# Patient Record
Sex: Female | Born: 1970 | Race: White | Hispanic: No | Marital: Married | Smoking: Never smoker
Health system: Southern US, Community
[De-identification: ages and names within clinical notes are randomized; demographics above are authoritative.]

## PROBLEM LIST (undated history)

## (undated) DIAGNOSIS — D759 Disease of blood and blood-forming organs, unspecified: Secondary | ICD-10-CM

## (undated) DIAGNOSIS — Z973 Presence of spectacles and contact lenses: Secondary | ICD-10-CM

## (undated) HISTORY — DX: Disease of blood and blood-forming organs, unspecified: D75.9

---

## 2009-11-17 ENCOUNTER — Ambulatory Visit: Payer: Self-pay | Admitting: Obstetrics & Gynecology

## 2009-11-30 ENCOUNTER — Ambulatory Visit: Payer: Self-pay | Admitting: Obstetrics & Gynecology

## 2009-12-03 HISTORY — PX: BREAST BIOPSY: SHX20

## 2009-12-22 ENCOUNTER — Ambulatory Visit: Payer: Self-pay | Admitting: Surgery

## 2009-12-23 LAB — PATHOLOGY REPORT

## 2013-01-06 ENCOUNTER — Ambulatory Visit: Payer: Self-pay

## 2013-07-07 ENCOUNTER — Ambulatory Visit: Payer: Self-pay

## 2014-01-12 ENCOUNTER — Ambulatory Visit: Payer: Self-pay

## 2014-06-18 ENCOUNTER — Other Ambulatory Visit: Payer: Self-pay | Admitting: Unknown Physician Specialty

## 2014-06-18 DIAGNOSIS — N6320 Unspecified lump in the left breast, unspecified quadrant: Secondary | ICD-10-CM

## 2014-07-14 ENCOUNTER — Ambulatory Visit
Admission: RE | Admit: 2014-07-14 | Discharge: 2014-07-14 | Disposition: A | Discharge status: Home / Self Care | Payer: PRIVATE HEALTH INSURANCE | Source: Ambulatory Visit | Attending: Unknown Physician Specialty | Admitting: Unknown Physician Specialty

## 2014-07-14 DIAGNOSIS — N63 Unspecified lump in breast: Secondary | ICD-10-CM | POA: Insufficient documentation

## 2014-07-14 DIAGNOSIS — N6320 Unspecified lump in the left breast, unspecified quadrant: Secondary | ICD-10-CM

## 2014-12-21 ENCOUNTER — Other Ambulatory Visit: Payer: Self-pay | Admitting: Unknown Physician Specialty

## 2014-12-21 DIAGNOSIS — N63 Unspecified lump in unspecified breast: Secondary | ICD-10-CM

## 2014-12-21 LAB — HM PAP SMEAR: HM Pap smear: NEGATIVE

## 2015-01-18 ENCOUNTER — Ambulatory Visit
Admission: RE | Admit: 2015-01-18 | Discharge: 2015-01-18 | Disposition: A | Discharge status: Home / Self Care | Payer: 59 | Source: Ambulatory Visit | Attending: Unknown Physician Specialty | Admitting: Unknown Physician Specialty

## 2015-01-18 ENCOUNTER — Other Ambulatory Visit: Payer: Self-pay | Admitting: Unknown Physician Specialty

## 2015-01-18 DIAGNOSIS — N63 Unspecified lump in unspecified breast: Secondary | ICD-10-CM

## 2015-06-13 ENCOUNTER — Other Ambulatory Visit: Payer: Self-pay | Admitting: Unknown Physician Specialty

## 2015-06-13 DIAGNOSIS — N63 Unspecified lump in unspecified breast: Secondary | ICD-10-CM

## 2015-07-19 ENCOUNTER — Ambulatory Visit
Admission: RE | Admit: 2015-07-19 | Discharge: 2015-07-19 | Disposition: A | Discharge status: Home / Self Care | Payer: 59 | Source: Ambulatory Visit | Attending: Unknown Physician Specialty | Admitting: Unknown Physician Specialty

## 2015-07-19 DIAGNOSIS — N63 Unspecified lump in unspecified breast: Secondary | ICD-10-CM

## 2016-01-10 ENCOUNTER — Other Ambulatory Visit: Payer: Self-pay | Admitting: Obstetrics & Gynecology

## 2016-01-10 DIAGNOSIS — N63 Unspecified lump in unspecified breast: Secondary | ICD-10-CM

## 2016-01-10 DIAGNOSIS — Z1231 Encounter for screening mammogram for malignant neoplasm of breast: Secondary | ICD-10-CM

## 2016-02-06 ENCOUNTER — Ambulatory Visit
Admission: RE | Admit: 2016-02-06 | Discharge: 2016-02-06 | Disposition: A | Discharge status: Home / Self Care | Payer: 59 | Source: Ambulatory Visit | Attending: Obstetrics & Gynecology | Admitting: Obstetrics & Gynecology

## 2016-02-06 DIAGNOSIS — Z1231 Encounter for screening mammogram for malignant neoplasm of breast: Secondary | ICD-10-CM

## 2016-02-06 DIAGNOSIS — N63 Unspecified lump in unspecified breast: Secondary | ICD-10-CM | POA: Diagnosis present

## 2016-02-06 DIAGNOSIS — N6321 Unspecified lump in the left breast, upper outer quadrant: Secondary | ICD-10-CM | POA: Insufficient documentation

## 2016-02-06 LAB — HM MAMMOGRAPHY

## 2016-02-21 ENCOUNTER — Other Ambulatory Visit: Payer: Self-pay | Admitting: Cardiovascular Disease

## 2016-02-21 DIAGNOSIS — N63 Unspecified lump in unspecified breast: Secondary | ICD-10-CM

## 2016-04-10 DIAGNOSIS — Z0000 Encounter for general adult medical examination without abnormal findings: Secondary | ICD-10-CM | POA: Diagnosis not present

## 2016-08-07 ENCOUNTER — Other Ambulatory Visit: Payer: PRIVATE HEALTH INSURANCE

## 2016-08-07 ENCOUNTER — Ambulatory Visit: Payer: PRIVATE HEALTH INSURANCE

## 2016-09-06 ENCOUNTER — Ambulatory Visit
Admission: RE | Admit: 2016-09-06 | Discharge: 2016-09-06 | Disposition: A | Discharge status: Home / Self Care | Payer: 59 | Source: Ambulatory Visit | Attending: Cardiovascular Disease | Admitting: Cardiovascular Disease

## 2016-09-06 DIAGNOSIS — R922 Inconclusive mammogram: Secondary | ICD-10-CM | POA: Diagnosis not present

## 2016-09-06 DIAGNOSIS — N6489 Other specified disorders of breast: Secondary | ICD-10-CM | POA: Diagnosis not present

## 2016-09-06 DIAGNOSIS — N6320 Unspecified lump in the left breast, unspecified quadrant: Secondary | ICD-10-CM | POA: Insufficient documentation

## 2016-09-06 DIAGNOSIS — N63 Unspecified lump in unspecified breast: Secondary | ICD-10-CM

## 2016-09-06 DIAGNOSIS — D242 Benign neoplasm of left breast: Secondary | ICD-10-CM | POA: Insufficient documentation

## 2016-12-09 DIAGNOSIS — Z23 Encounter for immunization: Secondary | ICD-10-CM | POA: Diagnosis not present

## 2017-01-17 ENCOUNTER — Encounter: Payer: Self-pay | Admitting: Obstetrics & Gynecology

## 2017-01-17 ENCOUNTER — Ambulatory Visit (INDEPENDENT_AMBULATORY_CARE_PROVIDER_SITE_OTHER): Payer: 59 | Admitting: Obstetrics & Gynecology

## 2017-01-17 VITALS — BP 130/90 | HR 82 | Ht 67.0 in | Wt 146.0 lb

## 2017-01-17 DIAGNOSIS — Z0000 Encounter for general adult medical examination without abnormal findings: Secondary | ICD-10-CM

## 2017-01-17 DIAGNOSIS — Z1231 Encounter for screening mammogram for malignant neoplasm of breast: Secondary | ICD-10-CM

## 2017-01-17 DIAGNOSIS — Z1239 Encounter for other screening for malignant neoplasm of breast: Secondary | ICD-10-CM

## 2017-01-17 DIAGNOSIS — Z01419 Encounter for gynecological examination (general) (routine) without abnormal findings: Secondary | ICD-10-CM | POA: Diagnosis not present

## 2017-01-17 NOTE — Patient Instructions (Signed)
PAP every three years Mammogram every year    Call [phone] to schedule at [facility] yearly (with PCP)   

## 2017-01-17 NOTE — Progress Notes (Signed)
HPI:      [name] is a 46 y.o. G0P0000 who LMP was Patient's last menstrual period was 01/07/2017., she presents today for her annual examination. The patient has no complaints today. The patient is sexually active. Her last pap: approximate date 2016 and was normal and last mammogram: approximate date 09/2016 ([facility]) and was normal. The patient does perform self breast exams.  There is notable family history of breast or ovarian cancer in her family.  The patient has regular exercise: yes.  The patient denies current symptoms of depression.    GYN History: Contraception: IUD  PMHx: History reviewed. No pertinent past medical history. Past Surgical History:  Procedure Laterality Date  . BREAST BIOPSY Right 12/03/2009   neg   Family History  Problem Relation Age of Onset  . Breast cancer Mother 41  . Diabetes Father    Social History   Tobacco Use  . Smoking status: Never Smoker  . Smokeless tobacco: Never Used  Substance Use Topics  . Alcohol use: Yes  . Drug use: No   No current outpatient medications on file. Allergies: Patient has no known allergies.  Review of Systems  Constitutional: Negative for chills, fever and malaise/fatigue.  HENT: Negative for congestion, sinus pain and sore throat.   Eyes: Negative for blurred vision and pain.  Respiratory: Negative for cough and wheezing.   Cardiovascular: Negative for chest pain and leg swelling.  Gastrointestinal: Negative for abdominal pain, constipation, diarrhea, heartburn, nausea and vomiting.  Genitourinary: Negative for dysuria, frequency, hematuria and urgency.  Musculoskeletal: Negative for back pain, joint pain, myalgias and neck pain.  Skin: Negative for itching and rash.  Neurological: Negative for dizziness, tremors and weakness.  Endo/Heme/Allergies: Does not bruise/bleed easily.  Psychiatric/Behavioral: Negative for depression. The patient is not nervous/anxious and does not have insomnia.      Objective: BP 130/90   Pulse 82   Ht 5\' 7"  (1.702 m)   Wt 146 lb (66.2 kg)   LMP 01/07/2017   BMI 22.87 kg/m   Filed Weights   01/17/17 0857  Weight: 146 lb (66.2 kg)   Body mass index is 22.87 kg/m. Physical Exam  Constitutional: She is oriented to person, place, and time. She appears well-developed and well-nourished. No distress.  Genitourinary: Rectum normal, vagina normal and uterus normal. Pelvic exam was performed with patient supine. There is no rash or lesion on the right labia. There is no rash or lesion on the left labia. Vagina exhibits no lesion. No bleeding in the vagina. Right adnexum does not display mass and does not display tenderness. Left adnexum does not display mass and does not display tenderness. Cervix does not exhibit motion tenderness, lesion, friability or polyp.   Uterus is mobile and midaxial. Uterus is not enlarged or exhibiting a mass.  Genitourinary Comments: IUD strings 2 cm  HENT:  Head: Normocephalic and atraumatic. Head is without laceration.  Right Ear: Hearing normal.  Left Ear: Hearing normal.  Nose: No epistaxis.  No foreign bodies.  Mouth/Throat: Uvula is midline, oropharynx is clear and moist and mucous membranes are normal.  Eyes: Pupils are equal, round, and reactive to light.  Neck: Normal range of motion. Neck supple. No thyromegaly present.  Cardiovascular: Normal rate and regular rhythm. Exam reveals no gallop and no friction rub.  No murmur heard. Pulmonary/Chest: Effort normal and breath sounds normal. No respiratory distress. She has no wheezes. Right breast exhibits no mass, no skin change and no  tenderness. Left breast exhibits no mass, no skin change and no tenderness.  Abdominal: Soft. Bowel sounds are normal. She exhibits no distension. There is no tenderness. There is no rebound.  Musculoskeletal: Normal range of motion.  Neurological: She is alert and oriented to person, place, and time. No cranial nerve deficit.  Skin:  Skin is warm and dry.  Psychiatric: She has a normal mood and affect. Judgment normal.  Vitals reviewed.   Assessment:  ANNUAL EXAM 1. Annual physical exam   2. Screening for breast cancer      Screening Plan:            1.  Cervical Screening-  Pap smear schedule reviewed with patient  2. Breast screening- Exam annually and mammogram>40 planned   3. Colonoscopy every 10 years, Hemoccult testing - after age 24  4. Labs managed by PCP  5. Counseling for contraception: IUD, due for exchange/removal 2020    F/U  Return in about 1 year (around 01/17/2018) for Annual.  Barnett Applebaum, MD, [facility], [facility] 01/17/2017  9:34 AM

## 2017-02-28 ENCOUNTER — Ambulatory Visit
Admission: RE | Admit: 2017-02-28 | Discharge: 2017-02-28 | Disposition: A | Discharge status: Home / Self Care | Payer: 59 | Source: Ambulatory Visit | Attending: Obstetrics & Gynecology | Admitting: Obstetrics & Gynecology

## 2017-02-28 DIAGNOSIS — Z1231 Encounter for screening mammogram for malignant neoplasm of breast: Secondary | ICD-10-CM | POA: Diagnosis not present

## 2017-02-28 DIAGNOSIS — Z1239 Encounter for other screening for malignant neoplasm of breast: Secondary | ICD-10-CM

## 2017-05-24 DIAGNOSIS — Z0000 Encounter for general adult medical examination without abnormal findings: Secondary | ICD-10-CM | POA: Diagnosis not present

## 2017-12-16 DIAGNOSIS — Z23 Encounter for immunization: Secondary | ICD-10-CM | POA: Diagnosis not present

## 2018-02-06 ENCOUNTER — Ambulatory Visit (INDEPENDENT_AMBULATORY_CARE_PROVIDER_SITE_OTHER): Payer: 59 | Admitting: Obstetrics & Gynecology

## 2018-02-06 ENCOUNTER — Encounter: Payer: Self-pay | Admitting: Obstetrics & Gynecology

## 2018-02-06 ENCOUNTER — Other Ambulatory Visit (HOSPITAL_COMMUNITY)
Admission: RE | Admit: 2018-02-06 | Discharge: 2018-02-06 | Disposition: A | Discharge status: Home / Self Care | Payer: 59 | Source: Ambulatory Visit | Attending: Obstetrics & Gynecology | Admitting: Obstetrics & Gynecology

## 2018-02-06 VITALS — BP 130/90 | Ht 67.0 in | Wt 140.0 lb

## 2018-02-06 DIAGNOSIS — Z124 Encounter for screening for malignant neoplasm of cervix: Secondary | ICD-10-CM | POA: Diagnosis present

## 2018-02-06 DIAGNOSIS — Z1239 Encounter for other screening for malignant neoplasm of breast: Secondary | ICD-10-CM

## 2018-02-06 DIAGNOSIS — Z01419 Encounter for gynecological examination (general) (routine) without abnormal findings: Secondary | ICD-10-CM | POA: Diagnosis not present

## 2018-02-06 DIAGNOSIS — Z0000 Encounter for general adult medical examination without abnormal findings: Secondary | ICD-10-CM

## 2018-02-06 NOTE — Patient Instructions (Signed)
PAP every three years Mammogram every year    Call [phone] to schedule at [facility] every 10 [facility] yearly (with PCP) 

## 2018-02-06 NOTE — Progress Notes (Signed)
HPI:      [name] is a 47 y.o. G0P0000 who LMP was Patient's last menstrual period was 01/16/2018., she presents today for her annual examination. The patient has no complaints today. The patient is sexually active. Her last pap: approximate date 2016 and was normal and last mammogram: approximate date 2018 and was normal. The patient does perform self breast exams.  There is no notable family history of breast or ovarian cancer in her family.  The patient has regular exercise: yes.  The patient denies current symptoms of depression.    GYN History: Contraception: IUD and it is a Paraguard year 9  PMHx: History reviewed. No pertinent past medical history. Past Surgical History:  Procedure Laterality Date  . BREAST BIOPSY Right 12/03/2009   neg   Family History  Problem Relation Age of Onset  . Breast cancer Mother 87  . Hypertension Mother   . Diabetes Father   . Hypertension Father    Social History   Tobacco Use  . Smoking status: Never Smoker  . Smokeless tobacco: Never Used  Substance Use Topics  . Alcohol use: Yes  . Drug use: No    Current Outpatient Medications:  .  PARAGARD INTRAUTERINE COPPER IU, by Intrauterine route., Disp: , Rfl:  Allergies: Patient has no known allergies.  Review of Systems  Constitutional: Negative for chills, fever and malaise/fatigue.  HENT: Negative for congestion, sinus pain and sore throat.   Eyes: Negative for blurred vision and pain.  Respiratory: Negative for cough and wheezing.   Cardiovascular: Negative for chest pain and leg swelling.  Gastrointestinal: Negative for abdominal pain, constipation, diarrhea, heartburn, nausea and vomiting.  Genitourinary: Negative for dysuria, frequency, hematuria and urgency.  Musculoskeletal: Negative for back pain, joint pain, myalgias and neck pain.  Skin: Negative for itching and rash.  Neurological: Negative for dizziness, tremors and weakness.  Endo/Heme/Allergies: Does not  bruise/bleed easily.  Psychiatric/Behavioral: Negative for depression. The patient is not nervous/anxious and does not have insomnia.    Objective: BP 130/90   Ht 5\' 7"  (1.702 m)   Wt 140 lb (63.5 kg)   LMP 01/16/2018   BMI 21.93 kg/m   Filed Weights   02/06/18 0945  Weight: 140 lb (63.5 kg)   Body mass index is 21.93 kg/m. Physical Exam  Constitutional: She is oriented to person, place, and time. She appears well-developed and well-nourished. No distress.  Genitourinary: Rectum normal, vagina normal and uterus normal. Pelvic exam was performed with patient supine. There is no rash or lesion on the right labia. There is no rash or lesion on the left labia. Vagina exhibits no lesion. No bleeding in the vagina. Right adnexum does not display mass and does not display tenderness. Left adnexum does not display mass and does not display tenderness. Cervix does not exhibit motion tenderness, lesion, friability or polyp.   Uterus is mobile and midaxial. Uterus is not enlarged or exhibiting a mass.  Genitourinary Comments: Strings 2 cm  HENT:  Head: Normocephalic and atraumatic. Head is without laceration.  Right Ear: Hearing normal.  Left Ear: Hearing normal.  Nose: No epistaxis.  No foreign bodies.  Mouth/Throat: Uvula is midline, oropharynx is clear and moist and mucous membranes are normal.  Eyes: Pupils are equal, round, and reactive to light.  Neck: Normal range of motion. Neck supple. No thyromegaly present.  Cardiovascular: Normal rate and regular rhythm. Exam reveals no gallop and no friction rub.  No murmur heard. Pulmonary/Chest: Effort  normal and breath sounds normal. No respiratory distress. She has no wheezes. Right breast exhibits no mass, no skin change and no tenderness. Left breast exhibits no mass, no skin change and no tenderness.  Abdominal: Soft. Bowel sounds are normal. She exhibits no distension. There is no tenderness. There is no rebound.  Musculoskeletal: Normal  range of motion.  Neurological: She is alert and oriented to person, place, and time. No cranial nerve deficit.  Skin: Skin is warm and dry.  Psychiatric: She has a normal mood and affect. Judgment normal.  Vitals reviewed.  Assessment:  ANNUAL EXAM 1. Annual physical exam   2. Screening for breast cancer   3. Screening for cervical cancer    Screening Plan:            1.  Cervical Screening-  Pap smear done today  2. Breast screening- Exam annually and mammogram>40 planned   3. Colonoscopy every 10 years, Hemoccult testing - after age 21  4. Labs managed by PCP  5. Counseling for contraception: IUD  Exchange or removal 2020    F/U  Return in about 1 year (around 02/07/2019) for Annual.  Barnett Applebaum, MD, [facility], [facility] 02/06/2018  10:14 AM

## 2018-02-07 LAB — CYTOLOGY - PAP
Diagnosis: NEGATIVE
HPV: NOT DETECTED

## 2018-03-12 ENCOUNTER — Ambulatory Visit
Admission: RE | Admit: 2018-03-12 | Discharge: 2018-03-12 | Disposition: A | Discharge status: Home / Self Care | Payer: 59 | Source: Ambulatory Visit | Attending: Obstetrics & Gynecology | Admitting: Obstetrics & Gynecology

## 2018-03-12 DIAGNOSIS — Z1231 Encounter for screening mammogram for malignant neoplasm of breast: Secondary | ICD-10-CM | POA: Diagnosis not present

## 2018-03-12 DIAGNOSIS — Z1239 Encounter for other screening for malignant neoplasm of breast: Secondary | ICD-10-CM | POA: Insufficient documentation

## 2018-07-31 ENCOUNTER — Encounter: Payer: Self-pay | Admitting: Obstetrics & Gynecology

## 2018-07-31 NOTE — Telephone Encounter (Signed)
Please advise 

## 2018-12-04 DIAGNOSIS — U071 COVID-19: Secondary | ICD-10-CM

## 2018-12-04 HISTORY — DX: COVID-19: U07.1

## 2018-12-12 ENCOUNTER — Other Ambulatory Visit: Payer: Self-pay

## 2018-12-12 DIAGNOSIS — Z20822 Contact with and (suspected) exposure to covid-19: Secondary | ICD-10-CM

## 2018-12-13 LAB — NOVEL CORONAVIRUS, NAA: SARS-CoV-2, NAA: DETECTED — AB

## 2019-03-11 ENCOUNTER — Encounter: Payer: Self-pay | Admitting: Obstetrics & Gynecology

## 2019-03-11 ENCOUNTER — Other Ambulatory Visit: Payer: Self-pay

## 2019-03-11 ENCOUNTER — Ambulatory Visit (INDEPENDENT_AMBULATORY_CARE_PROVIDER_SITE_OTHER): Payer: 59 | Admitting: Obstetrics & Gynecology

## 2019-03-11 VITALS — BP 120/80 | Ht 67.0 in | Wt 140.0 lb

## 2019-03-11 DIAGNOSIS — Z1231 Encounter for screening mammogram for malignant neoplasm of breast: Secondary | ICD-10-CM

## 2019-03-11 DIAGNOSIS — Z30432 Encounter for removal of intrauterine contraceptive device: Secondary | ICD-10-CM | POA: Diagnosis not present

## 2019-03-11 DIAGNOSIS — N951 Menopausal and female climacteric states: Secondary | ICD-10-CM

## 2019-03-11 DIAGNOSIS — Z01419 Encounter for gynecological examination (general) (routine) without abnormal findings: Secondary | ICD-10-CM | POA: Diagnosis not present

## 2019-03-11 NOTE — Progress Notes (Signed)
HPI:      [name] is a 49 y.o. G0P0000 who LMP was Patient's last menstrual period was 01/25/2019., and they have been irregular this past year missing a period every other month or so; also having some hot flashes at times (not too disruptive), as she presents today for her annual examination. The patient has no complaints today and is aware her Kristin Hutchinson is now 10 years in and in need of removal. The patient is sexually active. Her last pap: approximate date 2019 and was normal and last mammogram: approximate date 2020 and was normal. The patient does perform self breast exams.  There is no notable family history of breast or ovarian cancer in her family.  The patient has regular exercise: yes.  The patient denies current symptoms of depression.    GYN History: Contraception: IUD  PMHx: History reviewed. No pertinent past medical history. Past Surgical History:  Procedure Laterality Date  . BREAST BIOPSY Right 12/03/2009   neg   Family History  Problem Relation Age of Onset  . Breast cancer Mother 68  . Hypertension Mother   . Diabetes Father   . Hypertension Father    Social History   Tobacco Use  . Smoking status: Never Smoker  . Smokeless tobacco: Never Used  Substance Use Topics  . Alcohol use: Yes  . Drug use: No    Current Outpatient Medications:  .  PARAGARD INTRAUTERINE COPPER IU, by Intrauterine route., Disp: , Rfl:  Allergies: Patient has no known allergies.  Review of Systems  Constitutional: Negative for chills, fever and malaise/fatigue.  HENT: Negative for congestion, sinus pain and sore throat.   Eyes: Negative for blurred vision and pain.  Respiratory: Negative for cough and wheezing.   Cardiovascular: Negative for chest pain and leg swelling.  Gastrointestinal: Negative for abdominal pain, constipation, diarrhea, heartburn, nausea and vomiting.  Genitourinary: Negative for dysuria, frequency, hematuria and urgency.  Musculoskeletal: Negative  for back pain, joint pain, myalgias and neck pain.  Skin: Negative for itching and rash.  Neurological: Negative for dizziness, tremors and weakness.  Endo/Heme/Allergies: Does not bruise/bleed easily.  Psychiatric/Behavioral: Negative for depression. The patient is not nervous/anxious and does not have insomnia.     Objective: BP 120/80   Ht 5\' 7"  (1.702 m)   Wt 140 lb (63.5 kg)   LMP 01/25/2019   BMI 21.93 kg/m   Filed Weights   03/11/19 0927  Weight: 140 lb (63.5 kg)   Body mass index is 21.93 kg/m. Physical Exam Constitutional:      General: She is not in acute distress.    Appearance: She is well-developed.  Genitourinary:     Pelvic exam was performed with patient supine.     Vagina, uterus and rectum normal.     No lesions in the vagina.     No vaginal bleeding.     No cervical motion tenderness, friability, lesion or polyp.     IUD strings visualized.     Uterus is mobile.     Uterus is not enlarged.     No uterine mass detected.    Uterus is midaxial.     No right or left adnexal mass present.     Right adnexa not tender.     Left adnexa not tender.  HENT:     Head: Normocephalic and atraumatic. No laceration.     Right Ear: Hearing normal.     Left Ear: Hearing normal.  Mouth/Throat:     Pharynx: Uvula midline.  Eyes:     Pupils: Pupils are equal, round, and reactive to light.  Neck:     Thyroid: No thyromegaly.  Cardiovascular:     Rate and Rhythm: Normal rate and regular rhythm.     Heart sounds: No murmur. No friction rub. No gallop.   Pulmonary:     Effort: Pulmonary effort is normal. No respiratory distress.     Breath sounds: Normal breath sounds. No wheezing.  Chest:     Breasts:        Right: No mass, skin change or tenderness.        Left: No mass, skin change or tenderness.     Comments: Fibrous dense breast tissue Abdominal:     General: Bowel sounds are normal. There is no distension.     Palpations: Abdomen is soft.      Tenderness: There is no abdominal tenderness. There is no rebound.  Musculoskeletal:        General: Normal range of motion.     Cervical back: Normal range of motion and neck supple.  Neurological:     Mental Status: She is alert and oriented to person, place, and time.     Cranial Nerves: No cranial nerve deficit.  Skin:    General: Skin is warm and dry.  Psychiatric:        Judgment: Judgment normal.  Vitals reviewed.     Assessment:  ANNUAL EXAM 1. Women's annual routine gynecological examination   2. Encounter for screening mammogram for malignant neoplasm of breast   3. Perimenopausal   4. Encounter for IUD removal      Screening Plan:            1.  Cervical Screening-  Pap smear schedule reviewed with patient  2. Breast screening- Exam annually and mammogram>40 planned   3. Colonoscopy every 10 years, Hemoccult testing - after age 29  4. Labs managed by PCP  5. Counseling for contraception: no method As she is Perimenopausal, will check labs and not continue any contraception at this time - Estradiol - FSH/LH   6.  Encounter for IUD removal See below    F/U  Return in about 1 year (around 03/10/2020) for Annual.  Kristin Applebaum, MD, [facility], [facility] 03/11/2019  10:02 AM   History of Present Illness:  Kristin Hutchinson is a 49 y.o. that had a Paragard IUD placed approximately 10 years ago. Since that time, she states that It has done well but has expired.  The following portions of the patient's history were reviewed and updated as appropriate: allergies, current medications, past family history, past medical history, past social history, past surgical history and problem list.  Patient Active Problem List   Diagnosis Date Noted  . Annual physical exam 01/17/2017   Medications:  Current Outpatient Medications on File Prior to Visit  Medication Sig Dispense Refill  . PARAGARD INTRAUTERINE COPPER IU by Intrauterine route.     No  current facility-administered medications on file prior to visit.   Allergies: has No Known Allergies.  Physical Exam:  BP 120/80   Ht 5\' 7"  (1.702 m)   Wt 140 lb (63.5 kg)   LMP 01/25/2019   BMI 21.93 kg/m  Body mass index is 21.93 kg/m. Constitutional: Well nourished, well developed female in no acute distress.  Abdomen: diffusely non tender to palpation, non distended, and no masses, hernias Neuro: Grossly intact  Psych:  Normal mood and affect.    Pelvic exam:  Two IUD strings present seen coming from the cervical os. EGBUS, vaginal vault and cervix: within normal limits  IUD Removal Strings of IUD identified and grasped.  IUD removed without problem.  Pt tolerated this well.  IUD noted to be intact.  Assessment: IUD Removal  Plan: IUD removed and plan for contraception is no method. Labs (hormonal) to be checked She was amenable to this plan.  Kristin Hutchinson, M.D. 03/11/2019 10:04 AM

## 2019-03-11 NOTE — Patient Instructions (Signed)
PAP every three years Mammogram every year    Call [phone] to schedule at [facility] yearly (with PCP)   

## 2019-03-12 LAB — ESTRADIOL: Estradiol: 9.3 pg/mL

## 2019-03-12 LAB — FSH/LH
FSH: 68.2 m[IU]/mL
LH: 37 m[IU]/mL

## 2019-03-18 ENCOUNTER — Other Ambulatory Visit: Payer: Self-pay

## 2019-03-18 ENCOUNTER — Ambulatory Visit
Admission: RE | Admit: 2019-03-18 | Discharge: 2019-03-18 | Disposition: A | Discharge status: Home / Self Care | Payer: 59 | Source: Ambulatory Visit | Attending: Obstetrics & Gynecology | Admitting: Obstetrics & Gynecology

## 2019-03-18 DIAGNOSIS — Z1231 Encounter for screening mammogram for malignant neoplasm of breast: Secondary | ICD-10-CM | POA: Diagnosis not present

## 2019-04-24 IMAGING — MG DIGITAL SCREENING BILATERAL MAMMOGRAM WITH TOMO AND CAD
8 series · 9 of 24 positions shown · non-contrast
Comparison: Previous exam(s).

CLINICAL DATA: Screening.

EXAM:
DIGITAL SCREENING BILATERAL MAMMOGRAM WITH TOMO AND CAD

[L CC synth-2D]
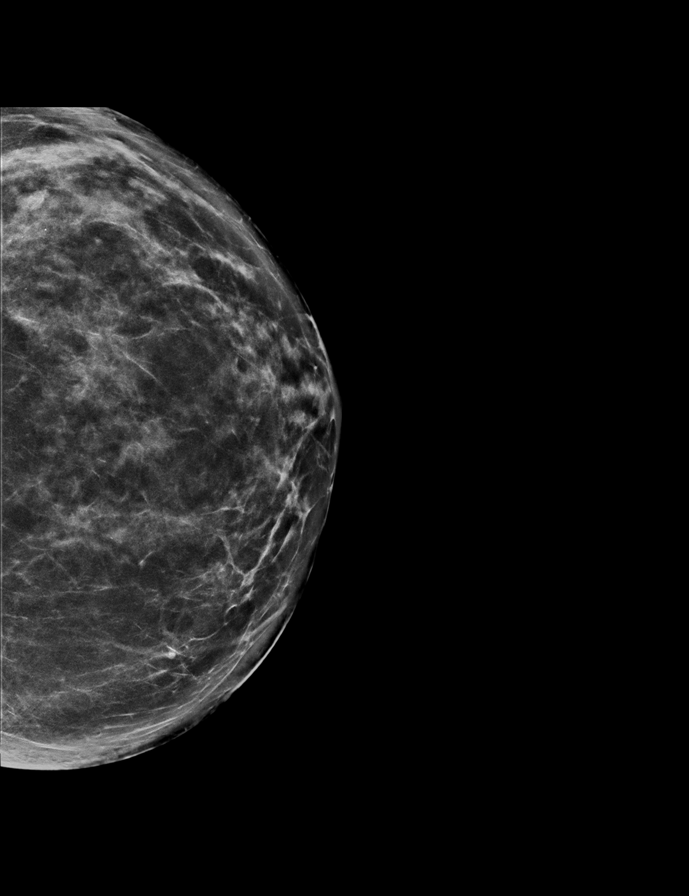

[L MLO synth-2D]
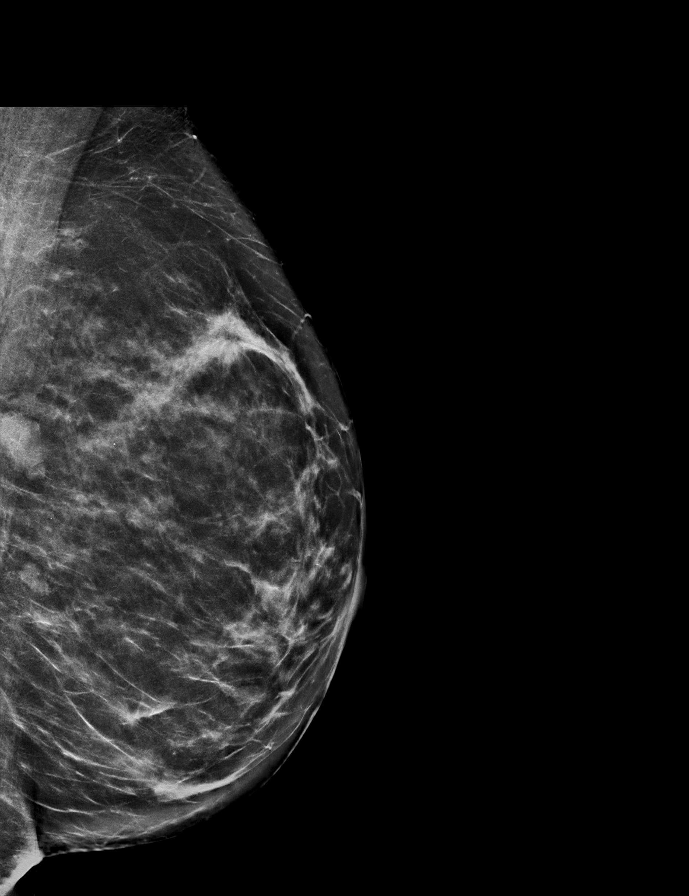

[R CC synth-2D]
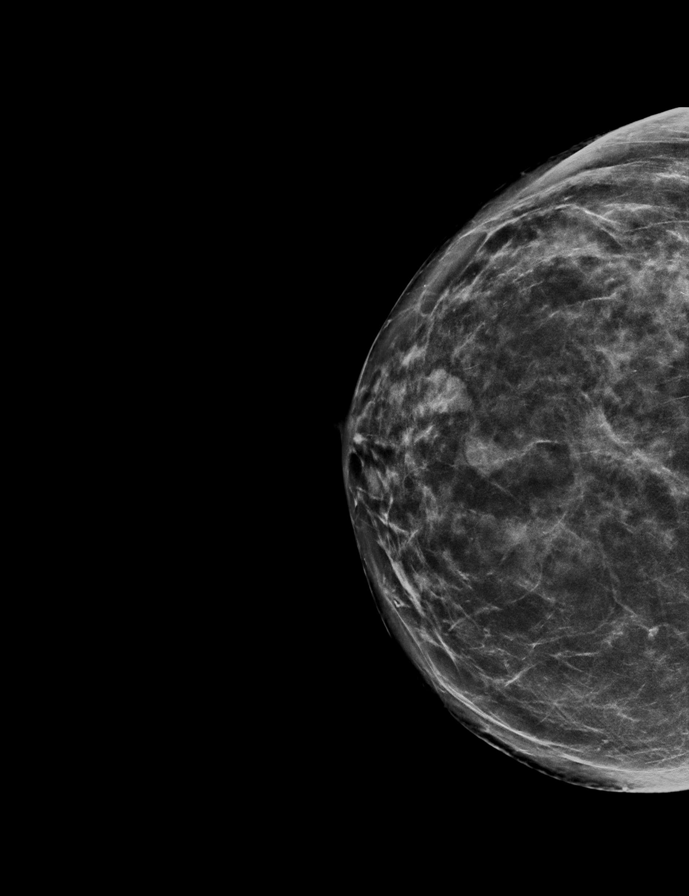

[R MLO synth-2D]
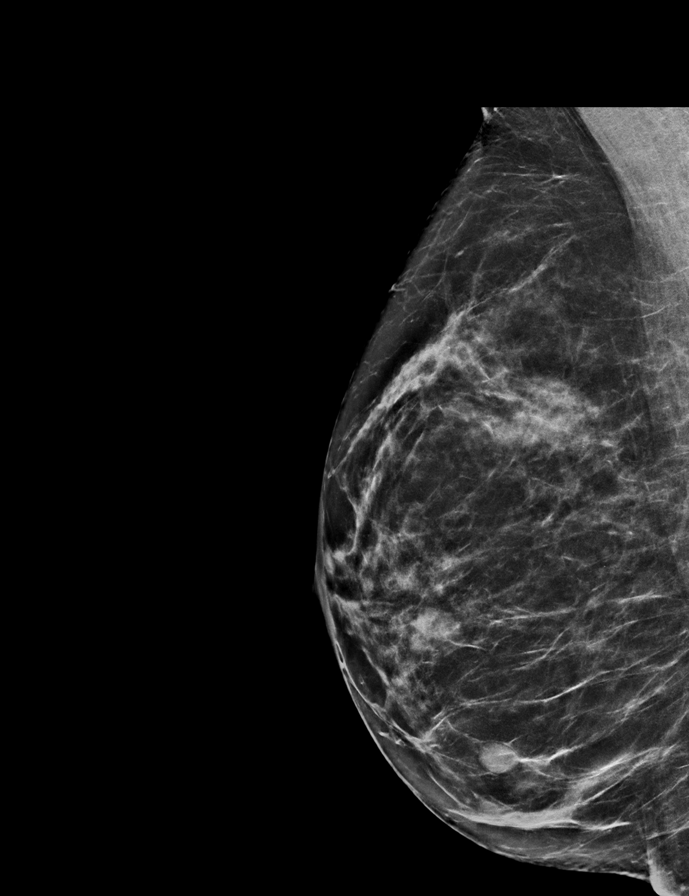

[L MLO tomo · 2 of 70 frames shown]
[frame 23/70]
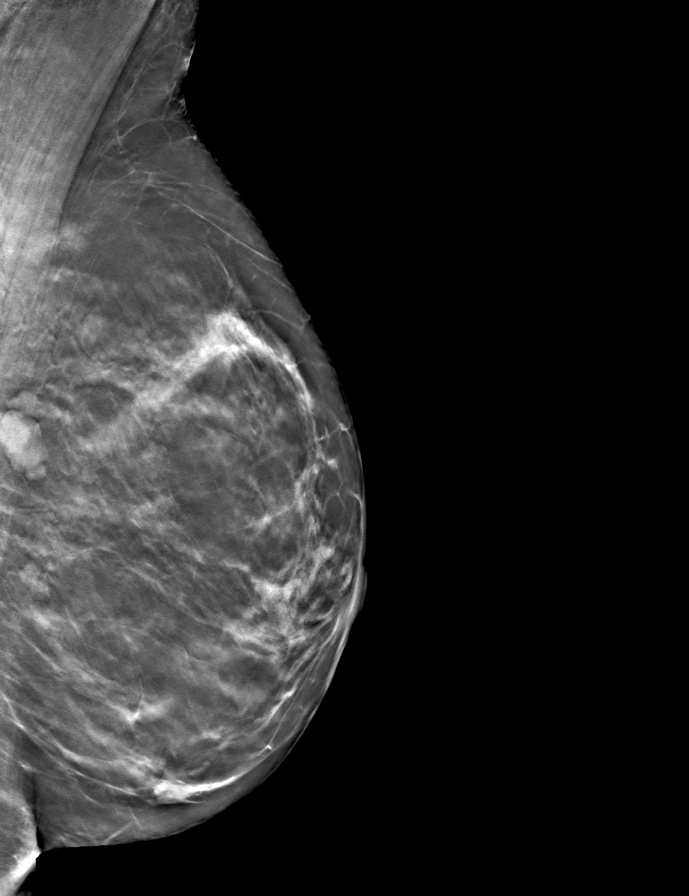
[frame 35/70]
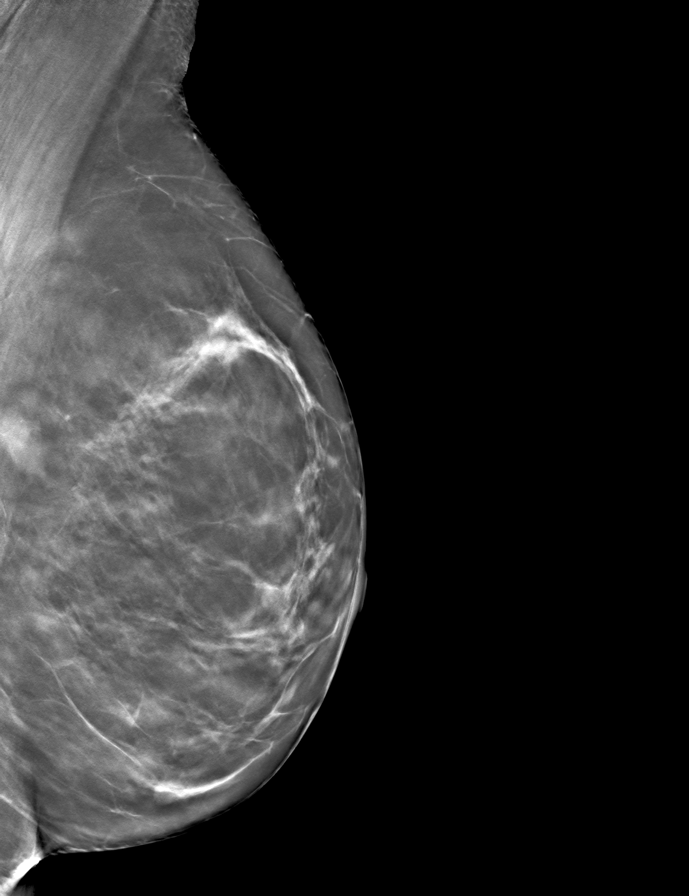

[L CC tomo · tomo slice 35/70.0]
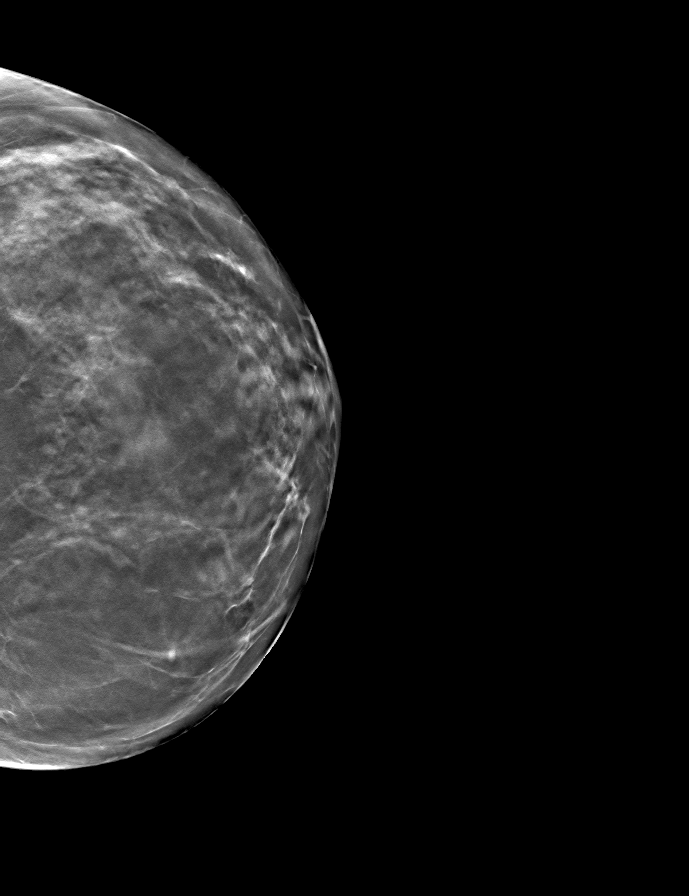

[R MLO tomo · tomo slice 33/65.0]
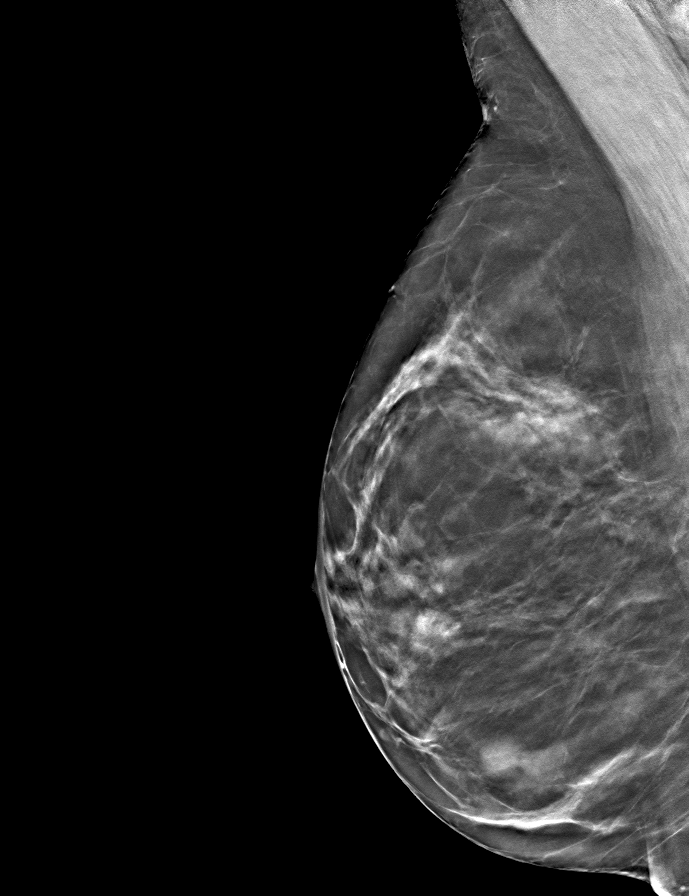

[R CC tomo · tomo slice 35/69.0]
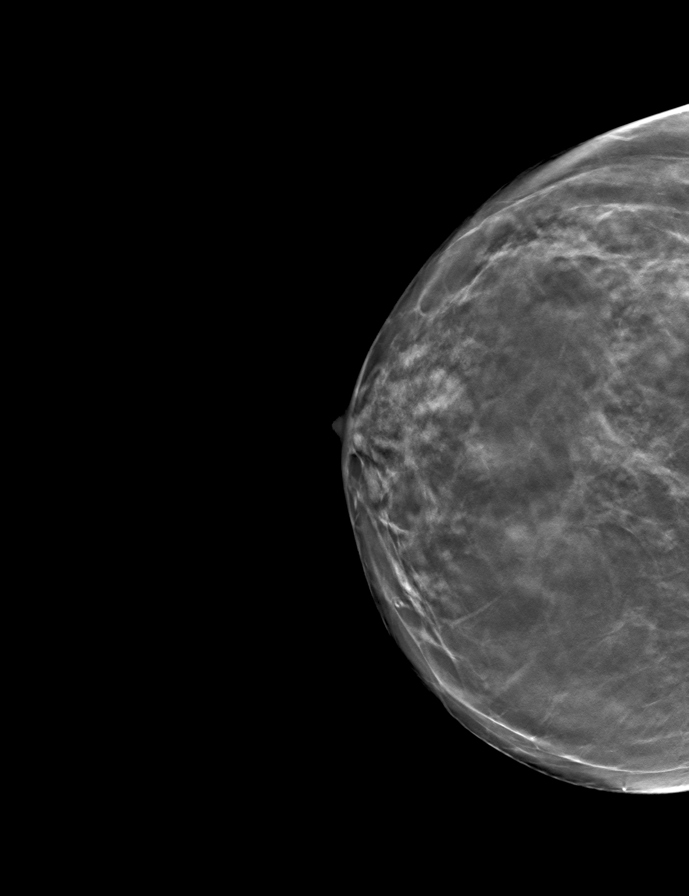

[9 of 24 positions shown; findings below may reference images not displayed]

ACR Breast Density Category b: There are scattered areas of
fibroglandular density.
FINDINGS: There are no findings suspicious for malignancy. Images were
processed with CAD.
IMPRESSION: No mammographic evidence of malignancy. A result letter of this
screening mammogram will be mailed directly to the patient.

RECOMMENDATION:
Screening mammogram in one year. (Code:[id number])

BI-RADS CATEGORY  1: Negative.

## 2019-10-20 NOTE — Progress Notes (Signed)
[facility]  [address] [place], [place] [address] Phone: [phone]  Fax: [phone]   Clinic Day:  10/21/2019  Referring physician: Sofie Hartigan, MD  Chief Complaint: Kristin Hutchinson is a 49 y.o. female with elevated ferritin and a family history of hemachromatosis who is referred in consultation by Dr. Thereasa Distance for assessment and management.   HPI: The patient saw Dr. Ellison Hughs on 09/21/2019.  She noted that her mother was diagnosed with hemachromatosis on 09/03/2019.  Labs revealed a hematocrit of 44.0, hemoglobin 15,0, platelets 214,000, WBC 7,300. CMP was normal. Ferritin was 672 with an iron saturation of 93% and a TIBC of 295.4. Urinalysis revealed trace blood and few bacteria.  Symptomatically, the patient has a runny nose all the time. Her vision has gotten worse lately, which she attributes to her age. Her weight fluctuates +/- 5 lbs. The patient denies fevers, sweats, headaches, sore throat, cough, shortness of breath, chest pain, palpitations, nausea, vomiting, diarrhea, reflux, urinary symptoms, bone or joint symptoms, skin changes, numbness, weakness, balance or coordination problems, and bleeding of any kind.  The patient has not had any other medical problems or surgeries. She does not take any medications regularly or use any herbal products. She eats meat almost daily. She is premenopausal, though she notes that her menses have been irregular since 05/2018. She never had any problems with her periods. She had her IUD removed in 03/2019.  The patient had the 3-series vaccination series for hepatitis B in high school because a cafeteria worker tested positive.   The patient's mother has breast cancer and hemachromatosis. She has two sisters and is unsure if they will be getting tested for hemachromatosis.   History reviewed. No pertinent past medical history.  Past Surgical History:  Procedure Laterality Date  . BREAST  BIOPSY Right 12/03/2009   neg    Family History  Problem Relation Age of Onset  . Breast cancer Mother 77  . Hypertension Mother   . Diabetes Father   . Hypertension Father     Social History:  reports that she has never smoked. She has never used smokeless tobacco. She reports current alcohol use. She reports that she does not use drugs. She drinks beer socially. The patient does not use tobacco. She denies exposure to radiation or toxins. Her mother is Kristin Hutchinson.  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 The patient is alone today.  Allergies: No Known Allergies  Current Medications: No current outpatient medications on file.   No current facility-administered medications for this visit.    Review of Systems  Constitutional: Negative for chills, diaphoresis, fever, malaise/fatigue and weight loss (fluctuates +/- 5 lbs).  HENT: Negative for congestion, ear discharge, ear pain, hearing loss, nosebleeds, sinus pain, sore throat and tinnitus.        Runny nose all the time.  Eyes: Negative for double vision, photophobia and pain.       Vision worsening, she attributes it to her age  Respiratory: Negative.  Negative for cough, hemoptysis, sputum production and shortness of breath.   Cardiovascular: Negative.  Negative for chest pain, palpitations and leg swelling.  Gastrointestinal: Negative.  Negative for abdominal pain, blood in stool, constipation, diarrhea, heartburn, melena, nausea and vomiting.  Genitourinary: Negative for dysuria, frequency, hematuria and urgency.       Irregular menses.  Musculoskeletal: Negative.  Negative for back pain, joint pain, myalgias and neck pain.  Skin: Negative.  Negative for itching and rash.  Neurological: Negative.  Negative for dizziness, tingling, sensory change, weakness and headaches.  Endo/Heme/Allergies: Negative.  Does not bruise/bleed easily.  Psychiatric/Behavioral: Negative.  Negative for  depression and memory loss. The patient is not nervous/anxious and does not have insomnia.   All other systems reviewed and are negative.  Performance status (ECOG): 0  Vitals Blood pressure (!) 155/99, pulse 84, temperature (!) 97.2 F (36.2 C), temperature source Tympanic, resp. rate 18, height 5\' 7"  (1.702 m), weight 142 lb 6.7 oz (64.6 kg), SpO2 100 %.   Physical Exam Vitals and nursing note reviewed.  Constitutional:      General: She is not in acute distress.    Appearance: She is not diaphoretic.     Interventions: Face mask in place.  HENT:     Head: Normocephalic and atraumatic.     Comments: Short styled brown hair.    Mouth/Throat:     Mouth: Mucous membranes are moist.     Pharynx: Oropharynx is clear.  Eyes:     General: No scleral icterus.    Extraocular Movements: Extraocular movements intact.     Conjunctiva/sclera: Conjunctivae normal.     Pupils: Pupils are equal, round, and reactive to light.     Comments: Brown eyes with contacts.  Cardiovascular:     Rate and Rhythm: Normal rate and regular rhythm.     Heart sounds: Normal heart sounds. No murmur heard.   Pulmonary:     Effort: Pulmonary effort is normal. No respiratory distress.     Breath sounds: Normal breath sounds. No wheezing or rales.  Chest:     Chest wall: No tenderness.  Abdominal:     General: Bowel sounds are normal. There is no distension.     Palpations: Abdomen is soft. There is no mass.     Tenderness: There is no abdominal tenderness. There is no guarding or rebound.  Musculoskeletal:        General: No swelling or tenderness. Normal range of motion.     Cervical back: Normal range of motion and neck supple.  Lymphadenopathy:     Head:     Right side of head: No preauricular, posterior auricular or occipital adenopathy.     Left side of head: No preauricular, posterior auricular or occipital adenopathy.     Cervical: No cervical adenopathy.     Upper Body:     Right upper body: No  supraclavicular or axillary adenopathy.     Left upper body: No supraclavicular or axillary adenopathy.     Lower Body: No right inguinal adenopathy. No left inguinal adenopathy.  Skin:    General: Skin is warm and dry.  Neurological:     Mental Status: She is alert and oriented to person, place, and time.  Psychiatric:        Behavior: Behavior normal.        Thought Content: Thought content normal.        Judgment: Judgment normal.    No visits with results within 3 Day(s) from this visit.  Latest known visit with results is:  Office Visit on 03/11/2019  Component Date Value Ref Range Status  . Estradiol 03/11/2019 9.3  pg/mL Final   Comment:                     Adult Female:  Follicular phase   68.3 -   166.0                       Ovulation phase    85.8 -   498.0                       Luteal phase       43.8 -   211.0                       Postmenopausal     <6.0 -    54.7                     Pregnancy                       1st trimester     215.0 - >4300.0 Roche ECLIA methodology   . LH 03/11/2019 37.0  mIU/mL Final   Comment:                     Adult Female:                       Follicular phase      2.4 -  12.6                       Ovulation phase      14.0 -  95.6                       Luteal phase          1.0 -  11.4                       Postmenopausal        7.7 -  58.5   . [facility] 03/11/2019 68.2  mIU/mL Final   Comment:                     Adult Female:                       Follicular phase      3.5 -  12.5                       Ovulation phase       4.7 -  21.5                       Luteal phase          1.7 -   7.7                       Postmenopausal       25.8 - 134.8     Assessment:  Kristin Hutchinson is a 49 y.o. female with elevated ferritin.  Ferritin was 672 with an iron saturation of 93% and a TIBC of 295.4 on 09/21/2019.  Family history is notable for hemochromatosis in her mother.  Her mother also has breast  cancer.  Symptomatically, she feels good.  She denies any symptoms.  Exam is unremarkable.  Plan: 1.   Labs today:  hemochromatosis assay, hepatitis B core antibody total, hepatitis B surface antigen, hepatitis B surface antibody and hepatitis C antibody. 2.  Elevated ferritin  Discuss likely diagnosis of hemochromatosis given elevated iron saturation and elevated ferritin.  Discuss formal testing.  Discuss therapeutic phlebotomy if diagnosis is confirmed to obtain a ferritin 50-100.  Discuss plan for monitoring for Kristin Hutchinson if hemochomatosis confirmed.  Several questions asked and answered. 3.   RTC in 1 week for MD assessment, labs (CBC with diff, ferritin, AFP) and +/- phlebotomy.  I discussed the assessment and treatment plan with the patient.  The patient was provided an opportunity to ask questions and all were answered.  The patient agreed with the plan and demonstrated an understanding of the instructions.  The patient was advised to call back if the symptoms worsen or if the condition fails to improve as anticipated.   Lyliana Dicenso C. Mike Gip, MD, PhD    10/21/2019, 11:10 AM  I, Mirian Mo Tufford, am acting as Education administrator for [employer]. Mike Gip, MD, PhD.  I, Cadel Stairs C. Mike Gip, MD, have reviewed the above documentation for accuracy and completeness, and I agree with the above.

## 2019-10-21 ENCOUNTER — Inpatient Hospital Stay: Payer: 59 | Attending: Hematology and Oncology | Admitting: Hematology and Oncology

## 2019-10-21 ENCOUNTER — Other Ambulatory Visit: Payer: Self-pay

## 2019-10-21 ENCOUNTER — Inpatient Hospital Stay: Payer: 59

## 2019-10-21 ENCOUNTER — Encounter: Payer: Self-pay | Admitting: Hematology and Oncology

## 2019-10-21 VITALS — BP 155/99 | HR 84 | Temp 97.2°F | Resp 18 | Ht 67.0 in | Wt 142.4 lb

## 2019-10-21 DIAGNOSIS — Z8349 Family history of other endocrine, nutritional and metabolic diseases: Secondary | ICD-10-CM

## 2019-10-21 DIAGNOSIS — Z832 Family history of diseases of the blood and blood-forming organs and certain disorders involving the immune mechanism: Secondary | ICD-10-CM | POA: Diagnosis not present

## 2019-10-21 DIAGNOSIS — Z79899 Other long term (current) drug therapy: Secondary | ICD-10-CM | POA: Diagnosis not present

## 2019-10-21 DIAGNOSIS — Z8249 Family history of ischemic heart disease and other diseases of the circulatory system: Secondary | ICD-10-CM | POA: Diagnosis not present

## 2019-10-21 DIAGNOSIS — Z803 Family history of malignant neoplasm of breast: Secondary | ICD-10-CM | POA: Insufficient documentation

## 2019-10-21 DIAGNOSIS — R7989 Other specified abnormal findings of blood chemistry: Secondary | ICD-10-CM

## 2019-10-21 DIAGNOSIS — Z833 Family history of diabetes mellitus: Secondary | ICD-10-CM | POA: Diagnosis not present

## 2019-10-21 DIAGNOSIS — R0989 Other specified symptoms and signs involving the circulatory and respiratory systems: Secondary | ICD-10-CM | POA: Diagnosis not present

## 2019-10-21 LAB — HEPATITIS B CORE ANTIBODY, TOTAL: Hep B Core Total Ab: NONREACTIVE

## 2019-10-21 LAB — HEPATITIS C ANTIBODY: HCV Ab: NONREACTIVE

## 2019-10-21 LAB — HEPATITIS B SURFACE ANTIGEN: Hepatitis B Surface Ag: NONREACTIVE

## 2019-10-21 NOTE — Patient Instructions (Signed)
Hemochromatosis Hemochromatosis is a condition in which the body stores too much iron. This is also called iron storage disease or iron overload disorder. The extra iron builds up in your joints, heart, liver, pancreas, and other organs, where it can cause damage. There are two forms of this condition; they include:  Hereditary hemochromatosis. Defects (mutations) on certain genes can cause symptoms to develop. With this type of the condition, the body absorbs more iron than it needs from the foods you eat.  Secondary hemochromatosis. With this type of the condition, iron builds up in the body due to other reasons, such as from liver disease or blood transfusions. What are the causes? This condition may be caused by:  Abnormal genes passed down from both parents (inherited).  Receiving blood from a donor (blood transfusion).  Problems with the way the body uses iron in the bone marrow (ineffective erythropoiesis).  Having chronic liver disease, such as hepatitis or liver cancer. What increases the risk? You are more likely to develop this condition if you:  Inherit certain abnormal gene mutations from both parents.  Are white (Caucasian).  Have severe or long-term (chronic) anemia. What are the signs or symptoms? Signs and symptoms can start at any age, but they usually start in middle age. They may include:  Fatigue.  Weakness.  Joint pain and stiffness.  Abdominal pain.  Weight loss.  Skin turning a gray or bronze color.  Loss of interest in sex.  Loss of menstrual periods, in women.  Loss of body hair.  Shortness of breath. As hemochromatosis gets worse, it may damage the liver, heart, or pancreas. This may lead to complications such as:  Diabetes.  Liver cancer.  Abnormal heart rhythms.  Heart failure. How is this diagnosed? This condition may be diagnosed based on:  Your symptoms and medical history.  A physical exam.  Blood tests.  Genetic  testing.  Removal and testing of a sample of liver tissue (liver biopsy). How is this treated? This condition is most often treated with:  Therapeutic phlebotomy. In this procedure, some of your blood is removed periodically in order to reduce the amount of iron in your body. Over time, your body will naturally replace the blood cells that you lose during phlebotomy. At the start of treatment, you may have a unit of blood removed once or twice a week. You will have blood tests during this time to determine when your iron levels return to normal. Once your iron levels are normal, you may only need to have a phlebotomy every few months.  Lifestyle changes. This may include not drinking alcohol and limiting certain items in your diet.  Medicines to remove excess iron (chelation therapy).  Medicines to help treat any related conditions.  Genetic counseling. If you are found to have a gene mutation, other family members may need to be tested for hereditary hemochromatosis. Follow these instructions at home: Alcohol use   If you have liver damage, do not drink alcohol.  If you do not have liver damage: ? Do not drink alcohol if:  Your health care provider tells you not to drink.  You are pregnant, may be pregnant, or are planning to become pregnant. ? If you drink alcohol, limit how much you have:  0-1 drink a day for women.  0-2 drinks a day for men. ? Be aware of how much alcohol is in your drink. In the [place] one drink equals one typical bottle of beer (12 oz), one-half glass  of wine (5 oz), or one shot of hard liquor (1 oz). General instructions  Stay active. Exercise for at least 30 minutes on most days of the week.  Take over-the-counter and prescription medicines only as told by your health care provider. This includes vitamins and supplements.  You may need to have frequent blood or urine testing to monitor your condition and to look for complications.  Follow any  instructions as directed by your health care provider regarding dietary restrictions.  Do not: ? Take vitamins or supplements that contain iron. ? Take vitamin C supplements. Vitamin C makes your body absorb more iron from foods. ? Eat raw shellfish or raw fish. Hemochromatosis may increase your chance for infection from these foods.  Keep all follow-up visits as told by your health care provider. This is important. Contact a health care provider if you have:  Fatigue.  Unusual weakness.  Shortness of breath.  Joint pain.  Abdominal pain.  Weight loss. Get help right away if you have:  Chest pain.  Trouble breathing. Summary  Hemochromatosis is a condition in which your body stores too much iron. This is also called iron storage disease or iron overload disorder.  The extra iron builds up in your joints, heart, liver, pancreas, and other organs, where it can cause damage.  Signs and symptoms can start at any age, but they usually start in middle age.  To treat this condition, you will need to have some of your blood removed periodically (therapeutic phlebotomy). Removing some of your blood also removes iron from your body.  You may need to have frequent blood or urine testing to monitor your condition and to look for complications. This information is not intended to replace advice given to you by your health care provider. Make sure you discuss any questions you have with your health care provider. Document Revised: 02/27/2017 Document Reviewed: 02/27/2017 Elsevier Patient Education  2020 [employer].

## 2019-10-22 LAB — HEPATITIS B SURFACE ANTIBODY, QUANTITATIVE: Hep B S AB Quant (Post): <3.1 m[IU]/mL — ABNORMAL LOW (ref >9.9)

## 2019-10-26 LAB — HEMOCHROMATOSIS DNA-PCR(C282Y,H63D)

## 2019-11-02 NOTE — Progress Notes (Signed)
[facility]  [address], [address] [place], [place] [address] Phone: [phone]  Fax: [phone]   Clinic Day:  11/03/2019  Referring physician: Sofie Hartigan, MD  Chief Complaint: Kristin Hutchinson is a 49 y.o. female with elevated ferritin and a family history of hemachromatosis who is seen for review of work-up and discussion regarding direction of therapy.  HPI: The patient was last seen in the hematology clinic on 10/21/2019 for new patient assessment.  Evaluation was prompted by her mother's recent diagnosis of hemochromatosis.  Labs revealed a ferritin was 672 with an iron saturation of 93% on 09/21/2019. She denied any symptoms.  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 H63D and S65C were negative.  Hepatitis B surface antibody was <3.1. Hepatitis B surface antigen, Hepatitis B core antibody, and Hepatitis C antibody were non reactive.   Symptomatically, she has been "good." Her periods are still irregular. The patient agrees to beginning a phlebotomy program today.  The patient notes that Tuesdays are not the best day for her going forward. She prefers any other day.   History reviewed. No pertinent past medical history.  Past Surgical History:  Procedure Laterality Date  . BREAST BIOPSY Right 12/03/2009   neg    Family History  Problem Relation Age of Onset  . Breast cancer Mother 25  . Hypertension Mother   . Diabetes Father   . Hypertension Father     Social History:  reports that she has never smoked. She has never used smokeless tobacco. She reports current alcohol use. She reports that she does not use drugs. She drinks beer socially. The patient does not use tobacco. She denies exposure to radiation or toxins. Her mother is Kristin Hutchinson.  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 The patient is alone today.  Allergies: No Known Allergies  Current Medications: No current outpatient medications on  file.   No current facility-administered medications for this visit.   Review of Systems  Constitutional: Positive for weight loss (1 lb). Negative for chills, diaphoresis, fever and malaise/fatigue.       Feels "good."  HENT: Negative.  Negative for congestion, ear discharge, ear pain, hearing loss, nosebleeds, sinus pain, sore throat and tinnitus.   Eyes: Negative for double vision, photophobia and pain.       Vision worsening, she attributes it to her age  Respiratory: Negative.  Negative for cough, hemoptysis, sputum production and shortness of breath.   Cardiovascular: Negative.  Negative for chest pain, palpitations and leg swelling.  Gastrointestinal: Negative.  Negative for abdominal pain, blood in stool, constipation, diarrhea, heartburn, melena, nausea and vomiting.  Genitourinary: Negative for dysuria, frequency, hematuria and urgency.       Irregular menses.  Musculoskeletal: Negative.  Negative for back pain, joint pain, myalgias and neck pain.  Skin: Negative.  Negative for itching and rash.  Neurological: Negative.  Negative for dizziness, tingling, sensory change, weakness and headaches.  Endo/Heme/Allergies: Negative.  Does not bruise/bleed easily.  Psychiatric/Behavioral: Negative.  Negative for depression and memory loss. The patient is not nervous/anxious and does not have insomnia.   All other systems reviewed and are negative.  Performance status (ECOG): 0  Vitals Blood pressure 138/80, pulse 83, temperature 97.8 F (36.6 C), temperature source Tympanic, resp. rate 18, height 5\' 7"  (1.702 m), weight 141 lb 10.3 oz (64.3 kg), SpO2 100 %.   Physical Exam Vitals and nursing note reviewed.  Constitutional:  General: She is not in acute distress.    Appearance: She is not diaphoretic.     Interventions: Face mask in place.  HENT:     Head: Normocephalic and atraumatic.     Comments: Short styled brown hair. Eyes:     Comments: Brown eyes with contacts.    Cardiovascular:     Rate and Rhythm: Normal rate and regular rhythm.     Heart sounds: Normal heart sounds. No murmur heard.   Pulmonary:     Effort: Pulmonary effort is normal. No respiratory distress.     Breath sounds: Normal breath sounds. No wheezing or rales.  Skin:    General: Skin is warm and dry.  Neurological:     Mental Status: She is alert and oriented to person, place, and time.  Psychiatric:        Behavior: Behavior normal.        Thought Content: Thought content normal.        Judgment: Judgment normal.    Appointment on 11/03/2019  Component Date Value Ref Range Status  . WBC 11/03/2019 5.7  4.0 - 10.5 K/uL Final  . RBC 11/03/2019 4.40  3.87 - 5.11 MIL/uL Final  . Hemoglobin 11/03/2019 15.2* 12.0 - 15.0 g/dL Final  . HCT 11/03/2019 43.8  36 - 46 % Final  . MCV 11/03/2019 99.5  80.0 - 100.0 fL Final  . MCH 11/03/2019 34.5* 26.0 - 34.0 pg Final  . MCHC 11/03/2019 34.7  30.0 - 36.0 g/dL Final  . RDW 11/03/2019 12.5  11.5 - 15.5 % Final  . Platelets 11/03/2019 242  150 - 400 K/uL Final  . nRBC 11/03/2019 0.0  0.0 - 0.2 % Final  . Neutrophils Relative % 11/03/2019 54  % Final  . Neutro Abs 11/03/2019 3.1  1.7 - 7.7 K/uL Final  . Lymphocytes Relative 11/03/2019 33  % Final  . Lymphs Abs 11/03/2019 1.9  0.7 - 4.0 K/uL Final  . Monocytes Relative 11/03/2019 10  % Final  . Monocytes Absolute 11/03/2019 0.6  0 - 1 K/uL Final  . Eosinophils Relative 11/03/2019 2  % Final  . Eosinophils Absolute 11/03/2019 0.1  0 - 0 K/uL Final  . Basophils Relative 11/03/2019 1  % Final  . Basophils Absolute 11/03/2019 0.0  0 - 0 K/uL Final  . Immature Granulocytes 11/03/2019 0  % Final  . Abs Immature Granulocytes 11/03/2019 0.02  0.00 - 0.07 K/uL Final   Performed at [facility], [address], 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 Initial ferritin was 672 with  an iron saturation of 93% and a TIBC of 295.4 on 09/21/2019.  Additional labs on 10/21/2019 revealed hepatitis B surface antibody was <3.1. Hepatitis B surface antigen, hepatitis B core antibody, and hepatitis C antibody were non reactive.   Ferritin has been followed: 672 on 09/21/2019 and 1016 on 11/03/2019.  Family history is notable for hemochromatosis in her mother.  Her mother also has breast cancer.  Symptomatically, she is doing well.  She denies any complaints.  Exam is stable.  Plan: 1.   Labs today: CBC with diff, ferritin, AFP. 2.   Elevated ferritin  Review hereditary hemochromatosis testing.  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  Review plan for [facility] surveillance every 6 months with AFP and RUQ  ultrasound.  Discuss consideration of hepatitis B immunization as serologies documented no immunity. 3.   Phlebotomy today. 4.   Patient needs appointments on any day except Tuesdays. 5.   Schedule RUQ ultrasound. 6.   RTC weekly x 6 for labs (HCT/Hgb, ferritin) and +/- phlebotomy.  Use ferritin from the week before. 7.   RTC in 8 weeks for MD assessment for labs (CBC with diff, ferritin), review imaging, and +/- phlebotomy.  I discussed the assessment and treatment plan with the patient.  The patient was provided an opportunity to ask questions and all were answered.  The patient agreed with the plan and demonstrated an understanding of the instructions.  The patient was advised to call back if the symptoms worsen or if the condition fails to improve as anticipated.   Dixie Jafri C. Mike Gip, MD, PhD    11/03/2019, 10:16 AM  I, Mirian Mo Tufford, am acting as Education administrator for [employer]. Mike Gip, MD, PhD.  I, Takeya Marquis C. Mike Gip, MD, have reviewed the above documentation for accuracy and completeness, and I agree with the above.

## 2019-11-03 ENCOUNTER — Encounter: Payer: Self-pay | Admitting: Hematology and Oncology

## 2019-11-03 ENCOUNTER — Inpatient Hospital Stay: Payer: 59

## 2019-11-03 ENCOUNTER — Inpatient Hospital Stay: Payer: 59 | Admitting: Hematology and Oncology

## 2019-11-03 ENCOUNTER — Other Ambulatory Visit: Payer: Self-pay

## 2019-11-03 VITALS — BP 128/86 | HR 80 | Resp 16

## 2019-11-03 DIAGNOSIS — Z8349 Family history of other endocrine, nutritional and metabolic diseases: Secondary | ICD-10-CM

## 2019-11-03 DIAGNOSIS — R7989 Other specified abnormal findings of blood chemistry: Secondary | ICD-10-CM

## 2019-11-03 LAB — FERRITIN: Ferritin: 1016 ng/mL — ABNORMAL HIGH (ref 11–307)

## 2019-11-03 LAB — CBC WITH DIFFERENTIAL/PLATELET
Abs Immature Granulocytes: 0.02 10*3/uL (ref 0.00–0.07)
Basophils Absolute: 0 10*3/uL (ref 0.0–0.1)
Basophils Relative: 1 %
Eosinophils Absolute: 0.1 10*3/uL (ref 0.0–0.5)
Eosinophils Relative: 2 %
HCT: 43.8 % (ref 36.0–46.0)
Hemoglobin: 15.2 g/dL — ABNORMAL HIGH (ref 12.0–15.0)
Immature Granulocytes: 0 %
Lymphocytes Relative: 33 %
Lymphs Abs: 1.9 10*3/uL (ref 0.7–4.0)
MCH: 34.5 pg — ABNORMAL HIGH (ref 26.0–34.0)
MCHC: 34.7 g/dL (ref 30.0–36.0)
MCV: 99.5 fL (ref 80.0–100.0)
Monocytes Absolute: 0.6 10*3/uL (ref 0.1–1.0)
Monocytes Relative: 10 %
Neutro Abs: 3.1 10*3/uL (ref 1.7–7.7)
Neutrophils Relative %: 54 %
Platelets: 242 10*3/uL (ref 150–400)
RBC: 4.4 MIL/uL (ref 3.87–5.11)
RDW: 12.5 % (ref 11.5–15.5)
WBC: 5.7 10*3/uL (ref 4.0–10.5)
nRBC: 0 % (ref 0.0–0.2)

## 2019-11-03 NOTE — Progress Notes (Signed)
No new changes noted today 

## 2019-11-04 LAB — AFP TUMOR MARKER: AFP, Serum, Tumor Marker: 5.6 ng/mL (ref 0.0–8.3)

## 2019-11-06 ENCOUNTER — Ambulatory Visit
Admission: RE | Admit: 2019-11-06 | Discharge: 2019-11-06 | Disposition: A | Discharge status: Home / Self Care | Payer: 59 | Source: Ambulatory Visit | Attending: Hematology and Oncology | Admitting: Hematology and Oncology

## 2019-11-06 ENCOUNTER — Other Ambulatory Visit: Payer: Self-pay

## 2019-11-06 ENCOUNTER — Ambulatory Visit: Payer: 59

## 2019-11-11 ENCOUNTER — Inpatient Hospital Stay: Payer: 59 | Attending: Hematology and Oncology

## 2019-11-11 ENCOUNTER — Other Ambulatory Visit: Payer: Self-pay

## 2019-11-11 ENCOUNTER — Inpatient Hospital Stay: Payer: 59

## 2019-11-11 VITALS — BP 128/66 | HR 82 | Resp 18

## 2019-11-11 DIAGNOSIS — R7989 Other specified abnormal findings of blood chemistry: Secondary | ICD-10-CM

## 2019-11-11 LAB — HEMOGLOBIN: Hemoglobin: 14.1 g/dL (ref 12.0–15.0)

## 2019-11-11 LAB — HEMATOCRIT: HCT: 40.5 % (ref 36.0–46.0)

## 2019-11-11 LAB — FERRITIN: Ferritin: 940 ng/mL — ABNORMAL HIGH (ref 11–307)

## 2019-11-18 ENCOUNTER — Other Ambulatory Visit: Payer: Self-pay

## 2019-11-18 ENCOUNTER — Inpatient Hospital Stay: Payer: 59

## 2019-11-18 VITALS — BP 125/82 | HR 76 | Temp 96.0°F | Resp 18

## 2019-11-18 DIAGNOSIS — R7989 Other specified abnormal findings of blood chemistry: Secondary | ICD-10-CM

## 2019-11-18 LAB — FERRITIN: Ferritin: 725 ng/mL — ABNORMAL HIGH (ref 11–307)

## 2019-11-18 LAB — HEMATOCRIT: HCT: 38.1 % (ref 36.0–46.0)

## 2019-11-18 LAB — HEMOGLOBIN: Hemoglobin: 13.2 g/dL (ref 12.0–15.0)

## 2019-11-18 MED ORDER — SODIUM CHLORIDE 0.9 % IV SOLN
Freq: Once | INTRAVENOUS | Status: AC
  Filled 2019-11-18: qty 250

## 2019-11-25 ENCOUNTER — Inpatient Hospital Stay: Payer: 59

## 2019-11-25 ENCOUNTER — Other Ambulatory Visit: Payer: Self-pay

## 2019-11-25 VITALS — BP 127/93 | HR 96

## 2019-11-25 DIAGNOSIS — R7989 Other specified abnormal findings of blood chemistry: Secondary | ICD-10-CM

## 2019-11-25 LAB — HEMATOCRIT: HCT: 38.9 % (ref 36.0–46.0)

## 2019-11-25 LAB — FERRITIN: Ferritin: 646 ng/mL — ABNORMAL HIGH (ref 11–307)

## 2019-11-25 LAB — HEMOGLOBIN: Hemoglobin: 13.1 g/dL (ref 12.0–15.0)

## 2019-12-02 ENCOUNTER — Inpatient Hospital Stay: Payer: 59

## 2019-12-07 ENCOUNTER — Inpatient Hospital Stay: Payer: 59 | Attending: Hematology and Oncology

## 2019-12-07 ENCOUNTER — Other Ambulatory Visit: Payer: Self-pay

## 2019-12-07 ENCOUNTER — Inpatient Hospital Stay: Payer: 59

## 2019-12-07 VITALS — BP 147/89 | HR 88 | Temp 96.7°F | Resp 18

## 2019-12-07 DIAGNOSIS — R7989 Other specified abnormal findings of blood chemistry: Secondary | ICD-10-CM

## 2019-12-07 LAB — FERRITIN: Ferritin: 540 ng/mL — ABNORMAL HIGH (ref 11–307)

## 2019-12-07 LAB — HEMATOCRIT: HCT: 40.1 % (ref 36.0–46.0)

## 2019-12-07 LAB — HEMOGLOBIN: Hemoglobin: 13.7 g/dL (ref 12.0–15.0)

## 2019-12-07 MED ORDER — SODIUM CHLORIDE 0.9 % IV SOLN
Freq: Once | INTRAVENOUS | Status: AC
  Filled 2019-12-07: qty 250

## 2019-12-09 ENCOUNTER — Inpatient Hospital Stay: Payer: 59

## 2019-12-16 ENCOUNTER — Inpatient Hospital Stay: Payer: 59

## 2019-12-16 ENCOUNTER — Other Ambulatory Visit: Payer: Self-pay

## 2019-12-16 ENCOUNTER — Other Ambulatory Visit: Payer: Self-pay | Admitting: Hematology and Oncology

## 2019-12-16 VITALS — BP 130/84 | HR 90 | Resp 18

## 2019-12-16 DIAGNOSIS — R7989 Other specified abnormal findings of blood chemistry: Secondary | ICD-10-CM

## 2019-12-16 LAB — FERRITIN: Ferritin: 411 ng/mL — ABNORMAL HIGH (ref 11–307)

## 2019-12-16 LAB — HEMATOCRIT: HCT: 39.1 % (ref 36.0–46.0)

## 2019-12-16 LAB — HEMOGLOBIN: Hemoglobin: 13.3 g/dL (ref 12.0–15.0)

## 2019-12-29 NOTE — Progress Notes (Signed)
[facility]  [address], [address] [place], [place] [address] Phone: [phone]  Fax: [phone]   Clinic Day:  12/30/2019  Referring physician: Sofie Hartigan, MD  Chief Complaint: Kristin Hutchinson is a 49 y.o. 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  HPI: The patient was last seen in the hematology clinic on 11/03/2019. At that time, she was doing well.  She denied any complaints.  Exam was stable. Hematocrit was 43.8, hemoglobin 15.2, platelets 242,000, WBC 5,700. Ferritin was 1,016. AFP was 5.6. She underwent therapeutic phlebotomy.  RUQ ultrasound on 11/06/2019 revealed an unremarkable right upper quadrant ultrasound. Specifically, there was no sonographic evidence of hepatic mass.  Labs followed: 11/11/2019: Hematocrit 40.5. Hemoglobin 14.1. Ferritin 940. 11/18/2019: Hematocrit 38.1. Hemoglobin 13.2. Ferritin 725. 11/25/2019: Hematocrit 38.9. Hemoglobin 13.1. Ferritin 646. 12/07/2019: Hematocrit 40.1. Hemoglobin 13.7. Ferritin 540. 12/16/2019: Hematocrit 39.1. Hemoglobin 13.3. Ferritin 411.  The patient underwent therapeutic phlebotomy weekly x 5 (11/11/2019 - 12/16/2019).  During the interim, she has been "good." Her periods are irregular; her last one was in 10/2019.   History reviewed. No pertinent past medical history.  Past Surgical History:  Procedure Laterality Date  . BREAST BIOPSY Right 12/03/2009   neg    Family History  Problem Relation Age of Onset  . Breast cancer Mother 54  . Hypertension Mother   . Diabetes Father   . Hypertension Father     Social History:  reports that she has never smoked. She has never used smokeless tobacco. She reports current alcohol use. She reports that she does not use drugs. She drinks beer socially. The patient does not use tobacco. She denies exposure to radiation or toxins. Her mother is Renaye Rakers.  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  She has no children. The patient is alone today.  Allergies: No Known Allergies  Current Medications: No current outpatient medications on file.   No current facility-administered medications for this visit.   Review of Systems  Constitutional: Negative for chills, diaphoresis, fever, malaise/fatigue and weight loss (up 2 lbs).       Feels "good."  HENT: Negative.  Negative for congestion, ear discharge, ear pain, hearing loss, nosebleeds, sinus pain, sore throat and tinnitus.   Eyes: Negative for double vision, photophobia and pain.       Vision worsening, she attributes it to her age  Respiratory: Negative.  Negative for cough, hemoptysis, sputum production and shortness of breath.   Cardiovascular: Negative.  Negative for chest pain, palpitations and leg swelling.  Gastrointestinal: Negative.  Negative for abdominal pain, blood in stool, constipation, diarrhea, heartburn, melena, nausea and vomiting.  Genitourinary: Negative for dysuria, frequency, hematuria and urgency.       Irregular menses.  Musculoskeletal: Negative.  Negative for back pain, joint pain, myalgias and neck pain.  Skin: Negative.  Negative for itching and rash.  Neurological: Negative.  Negative for dizziness, tingling, sensory change, weakness and headaches.  Endo/Heme/Allergies: Negative.  Does not bruise/bleed easily.  Psychiatric/Behavioral: Negative.  Negative for depression and memory loss. The patient is not nervous/anxious and does not have insomnia.   All other systems reviewed and are negative.  Performance status (ECOG): 0  Vitals Blood pressure (!) 152/91, pulse 79, temperature 97.9 F (36.6 C), temperature source Tympanic, resp. rate 18, height 5\' 7"  (1.702 m), weight 143 lb 13.6 oz (65.2 kg), SpO2 100 %.   Physical Exam Vitals and nursing note reviewed.  Constitutional:      General: She is not in acute distress.    Appearance: She is not diaphoretic.     Interventions: Face mask in place.   HENT:     Head: Normocephalic and atraumatic.     Comments: Short styled brown hair.    Mouth/Throat:     Mouth: Mucous membranes are moist.     Pharynx: Oropharynx is clear.  Eyes:     General: No scleral icterus.    Extraocular Movements: Extraocular movements intact.     Conjunctiva/sclera: Conjunctivae normal.     Pupils: Pupils are equal, round, and reactive to light.     Comments: Brown eyes with contacts.  Cardiovascular:     Rate and Rhythm: Normal rate and regular rhythm.     Heart sounds: Normal heart sounds. No murmur heard.   Pulmonary:     Effort: Pulmonary effort is normal. No respiratory distress.     Breath sounds: Normal breath sounds. No wheezing or rales.  Abdominal:     General: Bowel sounds are normal. There is no distension.     Palpations: Abdomen is soft. There is no mass.     Tenderness: There is no abdominal tenderness. There is no guarding or rebound.  Musculoskeletal:        General: No swelling or tenderness. Normal range of motion.     Cervical back: Normal range of motion and neck supple.  Lymphadenopathy:     Head:     Right side of head: No preauricular, posterior auricular or occipital adenopathy.     Left side of head: No preauricular, posterior auricular or occipital adenopathy.     Cervical: No cervical adenopathy.     Upper Body:     Right upper body: No supraclavicular or axillary adenopathy.     Left upper body: No supraclavicular or axillary adenopathy.  Skin:    General: Skin is warm and dry.  Neurological:     Mental Status: She is alert and oriented to person, place, and time.  Psychiatric:        Behavior: Behavior normal.        Thought Content: Thought content normal.        Judgment: Judgment normal.    Appointment on 12/30/2019  Component Date Value Ref Range Status  . WBC 12/30/2019 7.4  4.0 - 10.5 K/uL Final  . RBC 12/30/2019 3.83* 3.87 - 5.11 MIL/uL Final  . Hemoglobin 12/30/2019 14.1  12.0 - 15.0 g/dL Final  . HCT  12/30/2019 40.8  36 - 46 % Final  . MCV 12/30/2019 106.5* 80.0 - 100.0 fL Final  . MCH 12/30/2019 36.8* 26.0 - 34.0 pg Final  . MCHC 12/30/2019 34.6  30.0 - 36.0 g/dL Final  . RDW 12/30/2019 13.0  11.5 - 15.5 % Final  . Platelets 12/30/2019 276  150 - 400 K/uL Final  . nRBC 12/30/2019 0.0  0.0 - 0.2 % Final  . Neutrophils Relative % 12/30/2019 63  % Final  . Neutro Abs 12/30/2019 4.7  1.7 - 7.7 K/uL Final  . Lymphocytes Relative 12/30/2019 25  % Final  . Lymphs Abs 12/30/2019 1.9  0.7 - 4.0 K/uL Final  . Monocytes Relative 12/30/2019 10  % Final  . Monocytes Absolute 12/30/2019 0.7  0.1 - 1.0 K/uL Final  . Eosinophils Relative 12/30/2019 1  % Final  . Eosinophils Absolute 12/30/2019 0.1  0.0 - 0.5 K/uL Final  . Basophils Relative 12/30/2019 1  % Final  .  Basophils Absolute 12/30/2019 0.0  0.0 - 0.1 K/uL Final  . Immature Granulocytes 12/30/2019 0  % Final  . Abs Immature Granulocytes 12/30/2019 0.02  0.00 - 0.07 K/uL Final   Performed at [facility], [address], [place], [place] [address]    Assessment:  Kristin Hutchinson is a 49 y.o. 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 Initial ferritin was 672 with an iron saturation of 93% and a TIBC of 295.4 on 09/21/2019.  Additional labs on 10/21/2019 revealed hepatitis B surface antibody was <3.1. Hepatitis B surface antigen, hepatitis B core antibody, and hepatitis C antibody were non reactive.   Ferritin has been followed: 672 on 09/21/2019, 1016 on 11/03/2019, 940 on 11/11/2019, 725 on 11/18/2019, 646 on 11/25/2019, 540 on 12/07/2019, 411 on 12/16/2019, and 434 12/30/2019.  She has been on a phlebotomy program (500cc) since 11/03/2019 (last 12/16/2019).  RUQ ultrasound on 11/06/2019 was unremarkable.  There was no evidence of hepatic mass.  AFP was 5.6 on 11/03/2019.   Family history is notable for hemochromatosis in her mother and sister.  Her mother also has breast cancer.  Symptomatically,  she is doing well.  She voices no concerns.  Exam is stable.  Plan: 1.   Labs today: CBC with diff, ferritin. 2.   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  She undergoes therapeutic phlebotomy to maintain a ferritin 50-100.  She undergoes AFP and RUQ ultrasound for [place] surveillance.   Review interval RUQ ultrasound-unremarkable.  She has hepatitis B and C negative.   Previously we discussed immunizations for hepatitis B.  Ferritin is 434 today  Phlebotomy today. 3.   Patient needs appointments on any day except Tuesdays. 4.   RTC weekly x 4 for labs (HCT/Hgb, ferritin) and +/- phlebotomy.             Use ferritin from the week before. 5.   RTC every other week x 2 (after above) for labs (CBC with diff, ferritin), and +/- phlebotomy. 6.   RTC in 3 months for MD assessment, labs (CBC, ferritin- day before) and +/- phlebotomy.  I discussed the assessment and treatment plan with the patient.  The patient was provided an opportunity to ask questions and all were answered.  The patient agreed with the plan and demonstrated an understanding of the instructions.  The patient was advised to call back if the symptoms worsen or if the condition fails to improve as anticipated.   Takhia Spoon C. Mike Gip, MD, PhD    12/30/2019, 10:44 AM  I, Mirian Mo Tufford, am acting as Education administrator for [employer]. Mike Gip, MD, PhD.  I, Singleton Hickox C. Mike Gip, MD, have reviewed the above documentation for accuracy and completeness, and I agree with the above.

## 2019-12-30 ENCOUNTER — Other Ambulatory Visit: Payer: Self-pay

## 2019-12-30 ENCOUNTER — Encounter: Payer: Self-pay | Admitting: Hematology and Oncology

## 2019-12-30 ENCOUNTER — Ambulatory Visit: Payer: 59

## 2019-12-30 ENCOUNTER — Inpatient Hospital Stay: Payer: 59 | Admitting: Hematology and Oncology

## 2019-12-30 ENCOUNTER — Inpatient Hospital Stay: Payer: 59

## 2019-12-30 LAB — CBC WITH DIFFERENTIAL/PLATELET
Abs Immature Granulocytes: 0.02 10*3/uL (ref 0.00–0.07)
Basophils Absolute: 0 10*3/uL (ref 0.0–0.1)
Basophils Relative: 1 %
Eosinophils Absolute: 0.1 10*3/uL (ref 0.0–0.5)
Eosinophils Relative: 1 %
HCT: 40.8 % (ref 36.0–46.0)
Hemoglobin: 14.1 g/dL (ref 12.0–15.0)
Immature Granulocytes: 0 %
Lymphocytes Relative: 25 %
Lymphs Abs: 1.9 10*3/uL (ref 0.7–4.0)
MCH: 36.8 pg — ABNORMAL HIGH (ref 26.0–34.0)
MCHC: 34.6 g/dL (ref 30.0–36.0)
MCV: 106.5 fL — ABNORMAL HIGH (ref 80.0–100.0)
Monocytes Absolute: 0.7 10*3/uL (ref 0.1–1.0)
Monocytes Relative: 10 %
Neutro Abs: 4.7 10*3/uL (ref 1.7–7.7)
Neutrophils Relative %: 63 %
Platelets: 276 10*3/uL (ref 150–400)
RBC: 3.83 MIL/uL — ABNORMAL LOW (ref 3.87–5.11)
RDW: 13 % (ref 11.5–15.5)
WBC: 7.4 10*3/uL (ref 4.0–10.5)
nRBC: 0 % (ref 0.0–0.2)

## 2019-12-30 LAB — FERRITIN: Ferritin: 434 ng/mL — ABNORMAL HIGH (ref 11–307)

## 2019-12-30 NOTE — Progress Notes (Signed)
No new changes noted today 

## 2020-01-06 ENCOUNTER — Inpatient Hospital Stay: Payer: 59

## 2020-01-06 ENCOUNTER — Other Ambulatory Visit: Payer: Self-pay

## 2020-01-06 ENCOUNTER — Inpatient Hospital Stay: Payer: 59 | Attending: Hematology and Oncology

## 2020-01-06 VITALS — BP 130/80 | HR 86 | Temp 97.2°F | Resp 18

## 2020-01-06 DIAGNOSIS — R7989 Other specified abnormal findings of blood chemistry: Secondary | ICD-10-CM

## 2020-01-06 LAB — HEMATOCRIT: HCT: 36.6 % (ref 36.0–46.0)

## 2020-01-06 LAB — HEMOGLOBIN: Hemoglobin: 12.8 g/dL (ref 12.0–15.0)

## 2020-01-06 LAB — FERRITIN: Ferritin: 351 ng/mL — ABNORMAL HIGH (ref 11–307)

## 2020-01-06 NOTE — Progress Notes (Signed)
Therapeutic phlebotomy of 500 cc performed in clinic today without difficulty. Patient tolerated well. Patient discharged in stable condition.

## 2020-01-12 ENCOUNTER — Other Ambulatory Visit: Payer: Self-pay

## 2020-01-12 DIAGNOSIS — R7989 Other specified abnormal findings of blood chemistry: Secondary | ICD-10-CM

## 2020-01-13 ENCOUNTER — Inpatient Hospital Stay: Payer: 59

## 2020-01-13 ENCOUNTER — Other Ambulatory Visit: Payer: Self-pay

## 2020-01-13 DIAGNOSIS — R7989 Other specified abnormal findings of blood chemistry: Secondary | ICD-10-CM

## 2020-01-13 LAB — FERRITIN: Ferritin: 314 ng/mL — ABNORMAL HIGH (ref 11–307)

## 2020-01-13 LAB — HEMATOCRIT: HCT: 37 % (ref 36.0–46.0)

## 2020-01-13 LAB — HEMOGLOBIN: Hemoglobin: 12.6 g/dL (ref 12.0–15.0)

## 2020-01-13 NOTE — Progress Notes (Signed)
Phlebotomy of 500 cc performed in clinic today; patient tolerated well. Patient discharged in stable condition.

## 2020-01-20 ENCOUNTER — Inpatient Hospital Stay: Payer: 59

## 2020-01-20 ENCOUNTER — Other Ambulatory Visit: Payer: Self-pay

## 2020-01-20 DIAGNOSIS — R7989 Other specified abnormal findings of blood chemistry: Secondary | ICD-10-CM

## 2020-01-20 LAB — HEMATOCRIT: HCT: 37.6 % (ref 36.0–46.0)

## 2020-01-20 LAB — HEMOGLOBIN: Hemoglobin: 13 g/dL (ref 12.0–15.0)

## 2020-01-20 LAB — FERRITIN: Ferritin: 325 ng/mL — ABNORMAL HIGH (ref 11–307)

## 2020-01-20 NOTE — Progress Notes (Signed)
Patient received prescribed treatment today and discharged from clinic in stable condition.

## 2020-01-27 ENCOUNTER — Inpatient Hospital Stay: Payer: 59

## 2020-01-27 ENCOUNTER — Other Ambulatory Visit: Payer: Self-pay

## 2020-01-27 VITALS — BP 138/88 | HR 88 | Temp 97.8°F | Resp 18

## 2020-01-27 DIAGNOSIS — R7989 Other specified abnormal findings of blood chemistry: Secondary | ICD-10-CM

## 2020-01-27 LAB — HEMATOCRIT: HCT: 39.5 % (ref 36.0–46.0)

## 2020-01-27 LAB — FERRITIN: Ferritin: 278 ng/mL (ref 11–307)

## 2020-01-27 LAB — HEMOGLOBIN: Hemoglobin: 13 g/dL (ref 12.0–15.0)

## 2020-01-27 NOTE — Progress Notes (Signed)
Patient received prescribed treatment in clinic. Tolerated well. Patient stable at discharge. 

## 2020-02-08 ENCOUNTER — Other Ambulatory Visit: Payer: Self-pay

## 2020-02-08 DIAGNOSIS — R7989 Other specified abnormal findings of blood chemistry: Secondary | ICD-10-CM

## 2020-02-10 ENCOUNTER — Other Ambulatory Visit: Payer: Self-pay

## 2020-02-10 ENCOUNTER — Other Ambulatory Visit: Payer: Self-pay | Admitting: Hematology and Oncology

## 2020-02-10 ENCOUNTER — Inpatient Hospital Stay: Payer: 59 | Attending: Hematology and Oncology

## 2020-02-10 ENCOUNTER — Inpatient Hospital Stay: Payer: 59

## 2020-02-10 DIAGNOSIS — R7989 Other specified abnormal findings of blood chemistry: Secondary | ICD-10-CM

## 2020-02-10 DIAGNOSIS — D7589 Other specified diseases of blood and blood-forming organs: Secondary | ICD-10-CM

## 2020-02-10 LAB — CBC WITH DIFFERENTIAL/PLATELET
Abs Immature Granulocytes: 0.02 10*3/uL (ref 0.00–0.07)
Basophils Absolute: 0 10*3/uL (ref 0.0–0.1)
Basophils Relative: 1 %
Eosinophils Absolute: 0.1 10*3/uL (ref 0.0–0.5)
Eosinophils Relative: 1 %
HCT: 43 % (ref 36.0–46.0)
Hemoglobin: 14.5 g/dL (ref 12.0–15.0)
Immature Granulocytes: 0 %
Lymphocytes Relative: 26 %
Lymphs Abs: 1.6 10*3/uL (ref 0.7–4.0)
MCH: 35.5 pg — ABNORMAL HIGH (ref 26.0–34.0)
MCHC: 33.7 g/dL (ref 30.0–36.0)
MCV: 105.1 fL — ABNORMAL HIGH (ref 80.0–100.0)
Monocytes Absolute: 0.5 10*3/uL (ref 0.1–1.0)
Monocytes Relative: 8 %
Neutro Abs: 3.9 10*3/uL (ref 1.7–7.7)
Neutrophils Relative %: 64 %
Platelets: 236 10*3/uL (ref 150–400)
RBC: 4.09 MIL/uL (ref 3.87–5.11)
RDW: 12.1 % (ref 11.5–15.5)
WBC: 6.1 10*3/uL (ref 4.0–10.5)
nRBC: 0 % (ref 0.0–0.2)

## 2020-02-10 LAB — FERRITIN: Ferritin: 193 ng/mL (ref 11–307)

## 2020-02-10 NOTE — Progress Notes (Signed)
Pt tolerated phlebotomy 500cc's well, VSS, d/ced to home

## 2020-02-24 ENCOUNTER — Inpatient Hospital Stay: Payer: 59

## 2020-02-24 ENCOUNTER — Other Ambulatory Visit: Payer: Self-pay

## 2020-02-24 VITALS — BP 137/87 | HR 84 | Resp 18

## 2020-02-24 DIAGNOSIS — R7989 Other specified abnormal findings of blood chemistry: Secondary | ICD-10-CM

## 2020-02-24 DIAGNOSIS — D7589 Other specified diseases of blood and blood-forming organs: Secondary | ICD-10-CM

## 2020-02-24 LAB — CBC WITH DIFFERENTIAL/PLATELET
Abs Immature Granulocytes: 0.02 10*3/uL (ref 0.00–0.07)
Basophils Absolute: 0 10*3/uL (ref 0.0–0.1)
Basophils Relative: 1 %
Eosinophils Absolute: 0.1 10*3/uL (ref 0.0–0.5)
Eosinophils Relative: 2 %
HCT: 40.9 % (ref 36.0–46.0)
Hemoglobin: 14.3 g/dL (ref 12.0–15.0)
Immature Granulocytes: 0 %
Lymphocytes Relative: 30 %
Lymphs Abs: 1.8 10*3/uL (ref 0.7–4.0)
MCH: 35.3 pg — ABNORMAL HIGH (ref 26.0–34.0)
MCHC: 35 g/dL (ref 30.0–36.0)
MCV: 101 fL — ABNORMAL HIGH (ref 80.0–100.0)
Monocytes Absolute: 0.6 10*3/uL (ref 0.1–1.0)
Monocytes Relative: 10 %
Neutro Abs: 3.4 10*3/uL (ref 1.7–7.7)
Neutrophils Relative %: 57 %
Platelets: 286 10*3/uL (ref 150–400)
RBC: 4.05 MIL/uL (ref 3.87–5.11)
RDW: 11.6 % (ref 11.5–15.5)
WBC: 5.9 10*3/uL (ref 4.0–10.5)
nRBC: 0 % (ref 0.0–0.2)

## 2020-02-24 LAB — RETICULOCYTES
Immature Retic Fract: 5.6 % (ref 2.3–15.9)
RBC.: 4.03 MIL/uL (ref 3.87–5.11)
Retic Count, Absolute: 72.5 10*3/uL (ref 19.0–186.0)
Retic Ct Pct: 1.8 % (ref 0.4–3.1)

## 2020-02-24 LAB — TSH: TSH: 1.157 u[IU]/mL (ref 0.350–4.500)

## 2020-02-24 LAB — FERRITIN: Ferritin: 164 ng/mL (ref 11–307)

## 2020-02-24 LAB — FOLATE: Folate: 5.1 ng/mL — ABNORMAL LOW (ref >5.9)

## 2020-02-24 LAB — VITAMIN B12: Vitamin B-12: 94 pg/mL — ABNORMAL LOW (ref 180–914)

## 2020-02-24 NOTE — Progress Notes (Signed)
Pt tolerated 500cc phlebotomy well, VSS, d/ced home

## 2020-02-29 ENCOUNTER — Telehealth: Payer: Self-pay

## 2020-02-29 ENCOUNTER — Other Ambulatory Visit: Payer: Self-pay

## 2020-02-29 DIAGNOSIS — R7989 Other specified abnormal findings of blood chemistry: Secondary | ICD-10-CM

## 2020-02-29 NOTE — Telephone Encounter (Signed)
error 

## 2020-02-29 NOTE — Telephone Encounter (Signed)
Spoke with patient regarding b12 being low. Please begin b12 . Will recheck b12 in a one month.

## 2020-03-08 ENCOUNTER — Other Ambulatory Visit: Payer: Self-pay

## 2020-03-09 ENCOUNTER — Inpatient Hospital Stay: Payer: 59

## 2020-03-09 ENCOUNTER — Other Ambulatory Visit: Payer: Self-pay

## 2020-03-09 ENCOUNTER — Inpatient Hospital Stay: Payer: 59 | Attending: Hematology and Oncology

## 2020-03-09 VITALS — BP 134/87 | HR 88 | Temp 96.7°F | Resp 18

## 2020-03-09 DIAGNOSIS — R7989 Other specified abnormal findings of blood chemistry: Secondary | ICD-10-CM

## 2020-03-09 LAB — CBC WITH DIFFERENTIAL/PLATELET
Abs Immature Granulocytes: 0.02 10*3/uL (ref 0.00–0.07)
Basophils Absolute: 0 10*3/uL (ref 0.0–0.1)
Basophils Relative: 0 %
Eosinophils Absolute: 0.1 10*3/uL (ref 0.0–0.5)
Eosinophils Relative: 2 %
HCT: 42.5 % (ref 36.0–46.0)
Hemoglobin: 14.6 g/dL (ref 12.0–15.0)
Immature Granulocytes: 0 %
Lymphocytes Relative: 31 %
Lymphs Abs: 1.9 10*3/uL (ref 0.7–4.0)
MCH: 34.8 pg — ABNORMAL HIGH (ref 26.0–34.0)
MCHC: 34.4 g/dL (ref 30.0–36.0)
MCV: 101.4 fL — ABNORMAL HIGH (ref 80.0–100.0)
Monocytes Absolute: 0.6 10*3/uL (ref 0.1–1.0)
Monocytes Relative: 10 %
Neutro Abs: 3.5 10*3/uL (ref 1.7–7.7)
Neutrophils Relative %: 57 %
Platelets: 224 10*3/uL (ref 150–400)
RBC: 4.19 MIL/uL (ref 3.87–5.11)
RDW: 11.9 % (ref 11.5–15.5)
WBC: 6.2 10*3/uL (ref 4.0–10.5)
nRBC: 0 % (ref 0.0–0.2)

## 2020-03-09 LAB — FERRITIN: Ferritin: 177 ng/mL (ref 11–307)

## 2020-03-09 NOTE — Progress Notes (Signed)
Pt tolerated 500cc phlebotomy without difficulty, vss, pt d/ced home

## 2020-03-22 ENCOUNTER — Inpatient Hospital Stay: Payer: 59

## 2020-03-22 ENCOUNTER — Other Ambulatory Visit: Payer: Self-pay | Admitting: *Deleted

## 2020-03-22 ENCOUNTER — Other Ambulatory Visit: Payer: Self-pay

## 2020-03-22 LAB — CBC WITH DIFFERENTIAL/PLATELET
Abs Immature Granulocytes: 0.01 10*3/uL (ref 0.00–0.07)
Basophils Absolute: 0 10*3/uL (ref 0.0–0.1)
Basophils Relative: 1 %
Eosinophils Absolute: 0.1 10*3/uL (ref 0.0–0.5)
Eosinophils Relative: 2 %
HCT: 39.4 % (ref 36.0–46.0)
Hemoglobin: 13.7 g/dL (ref 12.0–15.0)
Immature Granulocytes: 0 %
Lymphocytes Relative: 30 %
Lymphs Abs: 1.8 10*3/uL (ref 0.7–4.0)
MCH: 34.9 pg — ABNORMAL HIGH (ref 26.0–34.0)
MCHC: 34.8 g/dL (ref 30.0–36.0)
MCV: 100.3 fL — ABNORMAL HIGH (ref 80.0–100.0)
Monocytes Absolute: 0.6 10*3/uL (ref 0.1–1.0)
Monocytes Relative: 10 %
Neutro Abs: 3.4 10*3/uL (ref 1.7–7.7)
Neutrophils Relative %: 57 %
Platelets: 230 10*3/uL (ref 150–400)
RBC: 3.93 MIL/uL (ref 3.87–5.11)
RDW: 12.1 % (ref 11.5–15.5)
WBC: 6 10*3/uL (ref 4.0–10.5)
nRBC: 0 % (ref 0.0–0.2)

## 2020-03-22 LAB — FERRITIN: Ferritin: 114 ng/mL (ref 11–307)

## 2020-03-22 NOTE — Progress Notes (Signed)
[facility]  [address] [place], [place] [address] Phone: [phone]  Fax: [phone]   Clinic Day:  03/23/2020  Referring physician: Sofie Hartigan, MD  Chief Complaint: Kristin Hutchinson is a 50 y.o. 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  HPI: The patient was last seen in the hematology clinic on 12/30/2019. At that time, she was doing well.  She voiced no concerns.  Exam was stable. Hematocrit was 40.8, hemoglobin 14.1, platelets 276,000, WBC 7,400. Ferritin was 434. She underwent phlebotomy.  Labs followed: 01/06/2020:  Hematocrit 36.6. Hemoglobin 12.8. Ferritin 351. 01/13/2020:  Hematocrit 37.0. Hemoglobin 12.6. Ferritin 314. 01/20/2020:  Hematocrit 37.6. Hemoglobin 13.0. Ferritin 325. 01/27/2020:  Hematocrit 39.5. Hemoglobin 13.0. Ferritin 278. 02/10/2020:  Hematocrit 43.0. Hemoglobin 14.5.  MCV 105.1  Ferritin 193. 02/24/2020:  Hematocrit 40.9. Hemoglobin 14.3.  MCV 101.0  Ferritin 164. 03/09/2020:  Hematocrit 42.5. Hemoglobin 14.6.  MCV 101.4  Ferritin 177. 03/22/2020:  Hematocrit 39.4. Hemoglobin 13.7.  MCV 100.3  Ferritin 114.  Additional labs from 02/24/2020: TSH 1.157. Reticulocyte count 1.8%. Vitamin B12 was 94 (low) and folate 5.1 (low).  She was contacted to begin B12 1000 mcg a day and folic acid 1 mg a day.  She underwent phlebotomy weekly x 4 (01/06/2020 - 01/27/2020), then biweekly x 3 (02/10/2020 and 03/09/2020).  During the interim, she has been "good." She has not had any side effects from her phlebotomies. She agrees to a phlebotomy today. She is taking vitamin B12 1000 mcg daily and folic acid 1 mg daily.   Her vision is stable. Her periods are irregular. She had a period in 02/2020, which was her first since 10/2019.   History reviewed. No pertinent past medical history.  Past Surgical History:  Procedure Laterality Date  . BREAST BIOPSY Right 12/03/2009   neg    Family  History  Problem Relation Age of Onset  . Breast cancer Mother 86  . Hypertension Mother   . Diabetes Father   . Hypertension Father     Social History:  reports that she has never smoked. She has never used smokeless tobacco. She reports current alcohol use. She reports that she does not use drugs. She drinks beer socially. The patient does not use tobacco. She denies exposure to radiation or toxins. Her mother is Renaye Rakers.  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 The patient is alone today.  Allergies: No Known Allergies  Current Medications: Current Outpatient Medications  Medication Sig Dispense Refill  . folic acid (FOLVITE) 1 MG tablet Take 1 mg by mouth daily.    . vitamin B-12 (CYANOCOBALAMIN) 1000 MCG tablet Take 1,000 mcg by mouth daily.     No current facility-administered medications for this visit.   Review of Systems  Constitutional: Negative for chills, diaphoresis, fever, malaise/fatigue and weight loss (up 5 lbs).       Feels "good."  HENT: Negative.  Negative for congestion, ear discharge, ear pain, hearing loss, nosebleeds, sinus pain, sore throat and tinnitus.   Eyes: Negative for double vision, photophobia and pain.       Vision is stable.  Respiratory: Negative.  Negative for cough, hemoptysis, sputum production and shortness of breath.   Cardiovascular: Negative.  Negative for chest pain, palpitations and leg swelling.  Gastrointestinal: Negative.  Negative for abdominal pain, blood in stool, constipation, diarrhea, heartburn, melena, nausea and vomiting.  Genitourinary: Negative for dysuria,  frequency, hematuria and urgency.       Irregular menses.  Musculoskeletal: Negative.  Negative for back pain, joint pain, myalgias and neck pain.  Skin: Negative.  Negative for itching and rash.  Neurological: Negative.  Negative for dizziness, tingling, sensory change, weakness and headaches.  Endo/Heme/Allergies: Negative.  Does  not bruise/bleed easily.  Psychiatric/Behavioral: Negative.  Negative for depression and memory loss. The patient is not nervous/anxious and does not have insomnia.   All other systems reviewed and are negative.  Performance status (ECOG): 0  Vitals Blood pressure (!) 141/76, pulse 79, temperature (!) 96 F (35.6 C), temperature source Tympanic, resp. rate 16, weight 148 lb 4.2 oz (67.3 kg), SpO2 100 %.   Physical Exam Vitals and nursing note reviewed.  Constitutional:      General: She is not in acute distress.    Appearance: She is not diaphoretic.     Interventions: Face mask in place.  HENT:     Head: Normocephalic and atraumatic.     Comments: Short styled brown hair.    Mouth/Throat:     Mouth: Mucous membranes are moist.     Pharynx: Oropharynx is clear.  Eyes:     General: No scleral icterus.    Extraocular Movements: Extraocular movements intact.     Conjunctiva/sclera: Conjunctivae normal.     Pupils: Pupils are equal, round, and reactive to light.     Comments: Brown eyes with contacts.  Cardiovascular:     Rate and Rhythm: Normal rate and regular rhythm.     Heart sounds: Normal heart sounds. No murmur heard.   Pulmonary:     Effort: Pulmonary effort is normal. No respiratory distress.     Breath sounds: Normal breath sounds. No wheezing or rales.  Chest:  Breasts:     Right: No axillary adenopathy or supraclavicular adenopathy.     Left: No axillary adenopathy or supraclavicular adenopathy.    Abdominal:     General: Bowel sounds are normal. There is no distension.     Palpations: Abdomen is soft. There is no hepatomegaly, splenomegaly or mass.     Tenderness: There is no abdominal tenderness. There is no guarding or rebound.  Musculoskeletal:        General: No swelling or tenderness. Normal range of motion.     Cervical back: Normal range of motion and neck supple.  Lymphadenopathy:     Head:     Right side of head: No preauricular, posterior  auricular or occipital adenopathy.     Left side of head: No preauricular, posterior auricular or occipital adenopathy.     Cervical: No cervical adenopathy.     Upper Body:     Right upper body: No supraclavicular or axillary adenopathy.     Left upper body: No supraclavicular or axillary adenopathy.  Skin:    General: Skin is warm and dry.  Neurological:     Mental Status: She is alert and oriented to person, place, and time.  Psychiatric:        Behavior: Behavior normal.        Thought Content: Thought content normal.        Judgment: Judgment normal.    Appointment on 03/22/2020  Component Date Value Ref Range Status  . WBC 03/22/2020 6.0  4.0 - 10.5 K/uL Final  . RBC 03/22/2020 3.93  3.87 - 5.11 MIL/uL Final  . Hemoglobin 03/22/2020 13.7  12.0 - 15.0 g/dL Final  . HCT 03/22/2020 39.4  36.0 -  46.0 % Final  . MCV 03/22/2020 100.3* 80.0 - 100.0 fL Final  . MCH 03/22/2020 34.9* 26.0 - 34.0 pg Final  . MCHC 03/22/2020 34.8  30.0 - 36.0 g/dL Final  . RDW 03/22/2020 12.1  11.5 - 15.5 % Final  . Platelets 03/22/2020 230  150 - 400 K/uL Final  . nRBC 03/22/2020 0.0  0.0 - 0.2 % Final  . Neutrophils Relative % 03/22/2020 57  % Final  . Neutro Abs 03/22/2020 3.4  1.7 - 7.7 K/uL Final  . Lymphocytes Relative 03/22/2020 30  % Final  . Lymphs Abs 03/22/2020 1.8  0.7 - 4.0 K/uL Final  . Monocytes Relative 03/22/2020 10  % Final  . Monocytes Absolute 03/22/2020 0.6  0.1 - 1.0 K/uL Final  . Eosinophils Relative 03/22/2020 2  % Final  . Eosinophils Absolute 03/22/2020 0.1  0.0 - 0.5 K/uL Final  . Basophils Relative 03/22/2020 1  % Final  . Basophils Absolute 03/22/2020 0.0  0.0 - 0.1 K/uL Final  . Immature Granulocytes 03/22/2020 0  % Final  . Abs Immature Granulocytes 03/22/2020 0.01  0.00 - 0.07 K/uL Final   Performed at [facility], [address] [place], [place] [address]  . Ferritin 03/22/2020 114  11 - 307 ng/mL Final   Performed at [facility], [address] [place], [place] [address]    Assessment:  DAPHANE FOLK is a 50 y.o. 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 Initial ferritin was 672 with an iron saturation of 93% and a TIBC of 295.4 on 09/21/2019.  Additional labs on 10/21/2019 revealed hepatitis B surface antibody was <3.1. Hepatitis B surface antigen, hepatitis B core antibody, and hepatitis C antibody were non reactive.   Ferritin has been followed: 672 on 09/21/2019, 1016 on 11/03/2019, 940 on 11/11/2019, 725 on 11/18/2019, 646 on 11/25/2019, 540 on 12/07/2019, 411 on 12/16/2019, 434 on 12/30/2019, 351 on 01/06/2020, 314 on 01/13/2020, 325 on 01/20/2020, 278 on 01/27/2020, 193 on 02/10/2020, 164 on 02/24/2020, 177 on 03/09/2020 and 114 on 03/22/2020.    She has been on a phlebotomy program (500cc) since 11/03/2019 (last 03/09/2020).  RUQ ultrasound on 11/06/2019 was unremarkable.  There was no evidence of hepatic mass.  AFP was 5.6 on 11/03/2019.   She developed B12 and folate deficiency.  B12 was 94 and folate 5.1 on 02/24/2020.  She is on oral B12 1000 mcg a day and folate 1 mg a day.  Family history is notable for hemochromatosis in her mother and sister.  Her mother also has breast cancer.  Symptomatically, she feels "good."  Menses are irregular. She had a period in 02/2020, which was her first since 10/2019.  Exam is stable.  Plan: 1.   Review interim labs.  2.   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  She undergoes therapeutic phlebotomy to maintain a ferritin 50-100.  She undergoes AFP and RUQ ultrasound for [place] surveillance.   RUQ ultrasound on 11/06/2019 was negative.     AFP was 5.6 (normal) on 11/03/2019.  She is hepatitis B and C negative.  Ferritin was 114 on 03/22/2020.  Phlebotomy today. 3.   B12 and folate deficiency  She was diagnosed with B12 and folate deficiency on 02/24/2020.  Check antiparietal cell antibodies and intrinsic factor antibodies  to rule out pernicious anemia.  Continue oral 123456 and folic acid.  Check 123456 and folic acid levels around 04/05/2020. 4.   Phlebotomy today. 5.  RTC on 04/05/2020 for labs (CBC, ferritin, B12, folate, anti-parietal cell antibody, intrinsic factor antibody) and +/- phlebotomy on 04/06/2020. 6.   RTC on 04/19/2020 for labs (HCT, Hgb, ferritin) and +/- phlebotomy on 04/20/2020. 7.   RTC on 03/01, 03/29 for labs (HCT, Hgb, ferritin) and call back if phlebotomy needed the next day.   8.   Add AFP on 05/03/2020. 9.   RUQ ultrasound on 05/05/2020. 10.   RTC on 06/30/2019 for MD assessment, labs (CBC with diff, CMP, ferritin- day before) and +/- phlebotomy.  I discussed the assessment and treatment plan with the patient.  The patient was provided an opportunity to ask questions and all were answered.  The patient agreed with the plan and demonstrated an understanding of the instructions.  The patient was advised to call back if the symptoms worsen or if the condition fails to improve as anticipated.   Kristin Jerrell C. Mike Gip, MD, PhD    03/23/2020, 10:39 AM  I, Mirian Mo Tufford, am acting as Education administrator for [employer]. Mike Gip, MD, PhD.  I, Briauna Gilmartin C. Mike Gip, MD, have reviewed the above documentation for accuracy and completeness, and I agree with the above.

## 2020-03-23 ENCOUNTER — Inpatient Hospital Stay (HOSPITAL_BASED_OUTPATIENT_CLINIC_OR_DEPARTMENT_OTHER): Payer: 59 | Admitting: Hematology and Oncology

## 2020-03-23 ENCOUNTER — Other Ambulatory Visit: Payer: Self-pay

## 2020-03-23 ENCOUNTER — Encounter: Payer: Self-pay | Admitting: Hematology and Oncology

## 2020-03-23 ENCOUNTER — Inpatient Hospital Stay: Payer: 59

## 2020-03-23 VITALS — BP 133/92 | HR 88

## 2020-03-23 DIAGNOSIS — E538 Deficiency of other specified B group vitamins: Secondary | ICD-10-CM | POA: Diagnosis not present

## 2020-03-23 DIAGNOSIS — D7589 Other specified diseases of blood and blood-forming organs: Secondary | ICD-10-CM

## 2020-03-23 DIAGNOSIS — R7989 Other specified abnormal findings of blood chemistry: Secondary | ICD-10-CM

## 2020-03-23 NOTE — Progress Notes (Signed)
Pt received prescribed treatment in clinic, pt stable at d/c. 

## 2020-03-31 ENCOUNTER — Other Ambulatory Visit: Payer: Self-pay

## 2020-03-31 ENCOUNTER — Other Ambulatory Visit: Payer: 59

## 2020-03-31 DIAGNOSIS — E538 Deficiency of other specified B group vitamins: Secondary | ICD-10-CM

## 2020-04-05 ENCOUNTER — Inpatient Hospital Stay: Payer: 59 | Attending: Hematology and Oncology

## 2020-04-05 ENCOUNTER — Other Ambulatory Visit: Payer: Self-pay

## 2020-04-05 DIAGNOSIS — D7589 Other specified diseases of blood and blood-forming organs: Secondary | ICD-10-CM | POA: Insufficient documentation

## 2020-04-05 DIAGNOSIS — E538 Deficiency of other specified B group vitamins: Secondary | ICD-10-CM

## 2020-04-05 DIAGNOSIS — R7989 Other specified abnormal findings of blood chemistry: Secondary | ICD-10-CM

## 2020-04-05 LAB — CBC
HCT: 42.4 % (ref 36.0–46.0)
Hemoglobin: 14.6 g/dL (ref 12.0–15.0)
MCH: 34.6 pg — ABNORMAL HIGH (ref 26.0–34.0)
MCHC: 34.4 g/dL (ref 30.0–36.0)
MCV: 100.5 fL — ABNORMAL HIGH (ref 80.0–100.0)
Platelets: 232 10*3/uL (ref 150–400)
RBC: 4.22 MIL/uL (ref 3.87–5.11)
RDW: 12.5 % (ref 11.5–15.5)
WBC: 6.4 10*3/uL (ref 4.0–10.5)
nRBC: 0 % (ref 0.0–0.2)

## 2020-04-05 LAB — FERRITIN: Ferritin: 109 ng/mL (ref 11–307)

## 2020-04-05 LAB — FOLATE: Folate: 13.5 ng/mL (ref >5.9)

## 2020-04-05 LAB — VITAMIN B12: Vitamin B-12: 234 pg/mL (ref 180–914)

## 2020-04-06 ENCOUNTER — Other Ambulatory Visit: Payer: Self-pay | Admitting: Hematology and Oncology

## 2020-04-06 ENCOUNTER — Inpatient Hospital Stay: Payer: 59

## 2020-04-06 ENCOUNTER — Telehealth: Payer: Self-pay

## 2020-04-06 VITALS — BP 137/93 | HR 77 | Temp 96.0°F | Resp 16

## 2020-04-06 DIAGNOSIS — R7989 Other specified abnormal findings of blood chemistry: Secondary | ICD-10-CM

## 2020-04-06 LAB — INTRINSIC FACTOR ANTIBODIES: Intrinsic Factor: 1 AU/mL (ref 0.0–1.1)

## 2020-04-06 LAB — ANTI-PARIETAL ANTIBODY: Parietal Cell Antibody-IgG: 18.4 Units (ref 0.0–20.0)

## 2020-04-06 MED ORDER — CYANOCOBALAMIN 1000 MCG/ML IJ SOLN
1000.0000 ug | Freq: Once | INTRAMUSCULAR | Status: AC
  Administered 2020-04-06: 1000 ug via INTRAMUSCULAR

## 2020-04-06 NOTE — Progress Notes (Signed)
Patient received prescribed treatment in clinic. Phlebotomy 500 cc and B12 injection. Tolerated well. Patient stable at discharge.

## 2020-04-12 ENCOUNTER — Other Ambulatory Visit: Payer: Self-pay

## 2020-04-12 ENCOUNTER — Ambulatory Visit (INDEPENDENT_AMBULATORY_CARE_PROVIDER_SITE_OTHER): Payer: 59 | Admitting: Obstetrics & Gynecology

## 2020-04-12 ENCOUNTER — Encounter: Payer: Self-pay | Admitting: Obstetrics & Gynecology

## 2020-04-12 VITALS — BP 100/60 | Ht 67.0 in | Wt 150.0 lb

## 2020-04-12 DIAGNOSIS — Z01419 Encounter for gynecological examination (general) (routine) without abnormal findings: Secondary | ICD-10-CM

## 2020-04-12 DIAGNOSIS — Z1211 Encounter for screening for malignant neoplasm of colon: Secondary | ICD-10-CM

## 2020-04-12 DIAGNOSIS — Z1231 Encounter for screening mammogram for malignant neoplasm of breast: Secondary | ICD-10-CM

## 2020-04-12 NOTE — Patient Instructions (Signed)
PAP every three years Mammogram every year    Call [phone] to schedule at [facility] Colonoscopy every 10 years  Thank you for choosing [facility]. As part of our ongoing efforts to improve patient experience, we would appreciate your feedback. Please fill out the short survey that you will receive by mail or MyChart. Your opinion is important to [place]! - Dr. Kenton Kingfisher  Recommendations to boost your immunity to prevent illness such as viral flu and colds, including covid19, are as follows:       - - -  Vitamin K2 and Vitamin D3  - - - Take Vitamin K2 at 200-300 mcg daily (usually 2-3 pills daily of the over the counter formulation). Take Vitamin D3 at 3000-4000 U daily (usually 3-4 pills daily of the over the counter formulation). Studies show that these two at high normal levels in your system are very effective in keeping your immunity so strong and protective that you will be unlikely to contract viral illness such as those listed above.  Dr Kenton Kingfisher

## 2020-04-12 NOTE — Progress Notes (Signed)
HPI:      [name] is a 50 y.o. G0P0000 who LMP was in the past, she presents today for her annual examination.  The patient has no complaints today. The patient is sexually active. Herlast pap: approximate date 2019 and was normal and last mammogram: approximate date 2021 and was normal.  The patient does perform self breast exams.  There is no notable family history of breast or ovarian cancer in her family. The patient is not taking hormone replacement therapy. Rare hot flash.  Cyclic bleeding is reported: periods every 3-4 mos this past year.   The patient has regular exercise: yes. The patient denies current symptoms of depression.    GYN Hx: Last Colonoscopy:never ago.  PMHx: Past Medical History:  Diagnosis Date  . Blood disorder    Past Surgical History:  Procedure Laterality Date  . BREAST BIOPSY Right 12/03/2009   neg   Family History  Problem Relation Age of Onset  . Breast cancer Mother 14  . Hypertension Mother   . Diabetes Father   . Hypertension Father    Social History   Tobacco Use  . Smoking status: Never Smoker  . Smokeless tobacco: Never Used  Vaping Use  . Vaping Use: Never used  Substance Use Topics  . Alcohol use: Yes  . Drug use: No    Current Outpatient Medications:  .  folic acid (FOLVITE) 1 MG tablet, Take 1 mg by mouth daily., Disp: , Rfl:  .  vitamin B-12 (CYANOCOBALAMIN) 1000 MCG tablet, Take 1,000 mcg by mouth daily., Disp: , Rfl:  Allergies: Patient has no known allergies.  Review of Systems  Constitutional: Negative for chills, fever and malaise/fatigue.  HENT: Negative for congestion, sinus pain and sore throat.   Eyes: Negative for blurred vision and pain.  Respiratory: Negative for cough and wheezing.   Cardiovascular: Negative for chest pain and leg swelling.  Gastrointestinal: Negative for abdominal pain, constipation, diarrhea, heartburn, nausea and vomiting.  Genitourinary: Negative for dysuria, frequency, hematuria and  urgency.  Musculoskeletal: Negative for back pain, joint pain, myalgias and neck pain.  Skin: Negative for itching and rash.  Neurological: Negative for dizziness, tremors and weakness.  Endo/Heme/Allergies: Does not bruise/bleed easily.  Psychiatric/Behavioral: Negative for depression. The patient is not nervous/anxious and does not have insomnia.     Objective: BP 100/60   Ht 5\' 7"  (1.702 m)   Wt 150 lb (68 kg)   BMI 23.49 kg/m   Filed Weights   04/12/20 0956  Weight: 150 lb (68 kg)   Body mass index is 23.49 kg/m. Physical Exam Constitutional:      General: She is not in acute distress.    Appearance: She is well-developed and well-nourished.  Genitourinary:     Vagina, uterus and rectum normal.     There is no rash or lesion on the right labia.     There is no rash or lesion on the left labia.    No lesions in the vagina.     No vaginal bleeding.      Right Adnexa: not tender and no mass present.    Left Adnexa: not tender and no mass present.    No cervical motion tenderness, friability, lesion or polyp.     Uterus is mobile.     Uterus is not enlarged.     No uterine mass detected.    Uterus is midaxial.     Pelvic exam was performed with patient supine.  Breasts:     Right: No mass, skin change or tenderness.     Left: No mass, skin change or tenderness.    HENT:     Head: Normocephalic and atraumatic. No laceration.     Right Ear: Hearing normal.     Left Ear: Hearing normal.     Nose: No epistaxis or foreign body.     Mouth/Throat:     Mouth: Oropharynx is clear and moist and mucous membranes are normal.     Pharynx: Uvula midline.  Eyes:     Pupils: Pupils are equal, round, and reactive to light.  Neck:     Thyroid: No thyromegaly.  Cardiovascular:     Rate and Rhythm: Normal rate and regular rhythm.     Heart sounds: No murmur heard. No friction rub. No gallop.   Pulmonary:     Effort: Pulmonary effort is normal. No respiratory distress.      Breath sounds: Normal breath sounds. No wheezing.  Abdominal:     General: Bowel sounds are normal. There is no distension.     Palpations: Abdomen is soft.     Tenderness: There is no abdominal tenderness. There is no rebound.  Musculoskeletal:        General: Normal range of motion.     Cervical back: Normal range of motion and neck supple.  Neurological:     Mental Status: She is alert and oriented to person, place, and time.     Cranial Nerves: No cranial nerve deficit.  Skin:    General: Skin is warm and dry.  Psychiatric:        Mood and Affect: Mood and affect normal.        Judgment: Judgment normal.  Vitals reviewed.     Assessment: Annual Exam 1. Women's annual routine gynecological examination   2. Encounter for screening mammogram for malignant neoplasm of breast   3. Screen for colon cancer     Plan:            1.  Cervical Screening-  Pap smear schedule reviewed with patient  2. Breast screening- Exam annually and mammogram scheduled  3. Colonoscopy every 10 years, Hemoccult testing after age 36  4. Labs managed by PCP  5. Counseling for hormonal therapy: none              6. FRAX - FRAX score for assessing the 10 year probability for fracture calculated and discussed today.  Based on age and score today, DEXA is not currently scheduled.    F/U  Return in about 1 year (around 04/12/2021) for Annual.  Barnett Applebaum, MD, [facility], [facility] 04/12/2020  10:43 AM

## 2020-04-18 ENCOUNTER — Other Ambulatory Visit: Payer: Self-pay

## 2020-04-18 DIAGNOSIS — R7989 Other specified abnormal findings of blood chemistry: Secondary | ICD-10-CM

## 2020-04-18 DIAGNOSIS — E538 Deficiency of other specified B group vitamins: Secondary | ICD-10-CM

## 2020-04-19 ENCOUNTER — Inpatient Hospital Stay: Payer: 59

## 2020-04-19 ENCOUNTER — Other Ambulatory Visit: Payer: Self-pay

## 2020-04-19 LAB — HEMOGLOBIN AND HEMATOCRIT, BLOOD
HCT: 41.3 % (ref 36.0–46.0)
Hemoglobin: 14.3 g/dL (ref 12.0–15.0)

## 2020-04-19 LAB — FERRITIN: Ferritin: 91 ng/mL (ref 11–307)

## 2020-04-20 ENCOUNTER — Other Ambulatory Visit: Payer: Self-pay

## 2020-04-20 ENCOUNTER — Other Ambulatory Visit: Payer: 59

## 2020-04-20 ENCOUNTER — Telehealth (INDEPENDENT_AMBULATORY_CARE_PROVIDER_SITE_OTHER): Payer: Self-pay | Admitting: Gastroenterology

## 2020-04-20 ENCOUNTER — Inpatient Hospital Stay: Payer: 59

## 2020-04-20 DIAGNOSIS — Z1211 Encounter for screening for malignant neoplasm of colon: Secondary | ICD-10-CM

## 2020-04-20 MED ORDER — NA SULFATE-K SULFATE-MG SULF 17.5-3.13-1.6 GM/177ML PO SOLN: 1.0000 | Freq: Once | ORAL | 0 refills | Status: AC

## 2020-04-20 NOTE — Progress Notes (Signed)
Gastroenterology Pre-Procedure Review  Request Date: Friday 03/04 Requesting Physician: Dr. Allen Norris  PATIENT REVIEW QUESTIONS: The patient responded to the following health history questions as indicated:    1. Are you having any GI issues? no 2. Do you have a personal history of Polyps? no 3. Do you have a family history of Colon Cancer or Polyps? no 4. Diabetes Mellitus? no 5. Joint replacements in the past 12 months?no 6. Major health problems in the past 3 months?no 7. Any artificial heart valves, MVP, or defibrillator?no    MEDICATIONS & ALLERGIES:    Patient reports the following regarding taking any anticoagulation/antiplatelet therapy:   Plavix, Coumadin, Eliquis, Xarelto, Lovenox, Pradaxa, Brilinta, or Effient? no Aspirin? no  Patient confirms/reports the following medications:  Current Outpatient Medications  Medication Sig Dispense Refill  . folic acid (FOLVITE) 1 MG tablet Take 1 mg by mouth daily.    . Na Sulfate-K Sulfate-Mg Sulf 17.5-3.13-1.6 GM/177ML SOLN Take 1 kit by mouth once for 1 dose. 354 mL 0  . vitamin B-12 (CYANOCOBALAMIN) 1000 MCG tablet Take 1,000 mcg by mouth daily.     No current facility-administered medications for this visit.    Patient confirms/reports the following allergies:  No Known Allergies  No orders of the defined types were placed in this encounter.   AUTHORIZATION INFORMATION Primary Insurance: 1D#: Group #:  Secondary Insurance: 1D#: Group #:  SCHEDULE INFORMATION: Date: Friday 03/04 Time: Location: [place]

## 2020-04-20 NOTE — Progress Notes (Signed)
Labs within normal parameters, MD left decision up to pt, we did 300cc phlebotomy as discussed, VSS, d/ced home

## 2020-04-26 ENCOUNTER — Ambulatory Visit
Admission: RE | Admit: 2020-04-26 | Discharge: 2020-04-26 | Disposition: A | Discharge status: Home / Self Care | Payer: 59 | Source: Ambulatory Visit | Attending: Obstetrics & Gynecology | Admitting: Obstetrics & Gynecology

## 2020-04-26 ENCOUNTER — Other Ambulatory Visit: Payer: Self-pay

## 2020-04-26 DIAGNOSIS — Z1231 Encounter for screening mammogram for malignant neoplasm of breast: Secondary | ICD-10-CM | POA: Insufficient documentation

## 2020-05-02 ENCOUNTER — Encounter: Payer: Self-pay | Admitting: Gastroenterology

## 2020-05-02 ENCOUNTER — Other Ambulatory Visit: Payer: Self-pay

## 2020-05-03 ENCOUNTER — Inpatient Hospital Stay: Payer: 59 | Attending: Hematology and Oncology

## 2020-05-03 ENCOUNTER — Inpatient Hospital Stay: Payer: 59

## 2020-05-03 DIAGNOSIS — R7989 Other specified abnormal findings of blood chemistry: Secondary | ICD-10-CM

## 2020-05-03 DIAGNOSIS — E538 Deficiency of other specified B group vitamins: Secondary | ICD-10-CM | POA: Diagnosis not present

## 2020-05-03 LAB — FERRITIN: Ferritin: 103 ng/mL (ref 11–307)

## 2020-05-03 LAB — HEMOGLOBIN AND HEMATOCRIT, BLOOD
HCT: 42.6 % (ref 36.0–46.0)
Hemoglobin: 14.8 g/dL (ref 12.0–15.0)

## 2020-05-03 MED ORDER — CYANOCOBALAMIN 1000 MCG/ML IJ SOLN
1000.0000 ug | Freq: Once | INTRAMUSCULAR | Status: AC
  Administered 2020-05-03: 1000 ug via INTRAMUSCULAR
  Filled 2020-05-03: qty 1

## 2020-05-04 ENCOUNTER — Other Ambulatory Visit: Payer: Self-pay

## 2020-05-04 ENCOUNTER — Other Ambulatory Visit
Admission: RE | Admit: 2020-05-04 | Discharge: 2020-05-04 | Disposition: A | Discharge status: Home / Self Care | Payer: 59 | Source: Ambulatory Visit | Attending: Gastroenterology | Admitting: Gastroenterology

## 2020-05-04 DIAGNOSIS — Z20822 Contact with and (suspected) exposure to covid-19: Secondary | ICD-10-CM | POA: Insufficient documentation

## 2020-05-04 DIAGNOSIS — Z01812 Encounter for preprocedural laboratory examination: Secondary | ICD-10-CM | POA: Diagnosis not present

## 2020-05-04 LAB — AFP TUMOR MARKER: AFP, Serum, Tumor Marker: 3.8 ng/mL (ref 0.0–8.3)

## 2020-05-04 LAB — SARS CORONAVIRUS 2 (TAT 6-24 HRS): SARS Coronavirus 2: NEGATIVE

## 2020-05-05 ENCOUNTER — Other Ambulatory Visit: Payer: Self-pay

## 2020-05-05 ENCOUNTER — Ambulatory Visit
Admission: RE | Admit: 2020-05-05 | Discharge: 2020-05-05 | Disposition: A | Discharge status: Home / Self Care | Payer: 59 | Source: Ambulatory Visit | Attending: Hematology and Oncology | Admitting: Hematology and Oncology

## 2020-05-05 NOTE — Discharge Instructions (Signed)

## 2020-05-06 ENCOUNTER — Other Ambulatory Visit: Payer: Self-pay

## 2020-05-06 ENCOUNTER — Ambulatory Visit: Payer: 59 | Admitting: Anesthesiology

## 2020-05-06 ENCOUNTER — Encounter
Admission: RE | Disposition: A | Discharge status: Home / Self Care | Payer: Self-pay | Source: Home / Self Care | Attending: Gastroenterology

## 2020-05-06 ENCOUNTER — Ambulatory Visit
Admission: RE | Admit: 2020-05-06 | Discharge: 2020-05-06 | Disposition: A | Discharge status: Home / Self Care | Payer: 59 | Attending: Gastroenterology | Admitting: Gastroenterology

## 2020-05-06 ENCOUNTER — Encounter: Payer: Self-pay | Admitting: Gastroenterology

## 2020-05-06 DIAGNOSIS — K635 Polyp of colon: Secondary | ICD-10-CM | POA: Insufficient documentation

## 2020-05-06 DIAGNOSIS — Z1211 Encounter for screening for malignant neoplasm of colon: Secondary | ICD-10-CM | POA: Diagnosis not present

## 2020-05-06 DIAGNOSIS — Z833 Family history of diabetes mellitus: Secondary | ICD-10-CM | POA: Insufficient documentation

## 2020-05-06 DIAGNOSIS — Z8616 Personal history of COVID-19: Secondary | ICD-10-CM | POA: Diagnosis not present

## 2020-05-06 DIAGNOSIS — K648 Other hemorrhoids: Secondary | ICD-10-CM | POA: Insufficient documentation

## 2020-05-06 DIAGNOSIS — K621 Rectal polyp: Secondary | ICD-10-CM

## 2020-05-06 DIAGNOSIS — Z803 Family history of malignant neoplasm of breast: Secondary | ICD-10-CM | POA: Insufficient documentation

## 2020-05-06 DIAGNOSIS — Z8249 Family history of ischemic heart disease and other diseases of the circulatory system: Secondary | ICD-10-CM | POA: Diagnosis not present

## 2020-05-06 HISTORY — DX: Hereditary hemochromatosis: E83.110

## 2020-05-06 HISTORY — PX: COLONOSCOPY WITH PROPOFOL: SHX5780

## 2020-05-06 HISTORY — DX: Presence of spectacles and contact lenses: Z97.3

## 2020-05-06 HISTORY — PX: POLYPECTOMY: SHX5525

## 2020-05-06 LAB — POCT PREGNANCY, URINE: Preg Test, Ur: NEGATIVE

## 2020-05-06 SURGERY — COLONOSCOPY WITH PROPOFOL
Anesthesia: General | Site: Rectum

## 2020-05-06 MED ORDER — STERILE WATER FOR IRRIGATION IR SOLN: Status: DC | PRN

## 2020-05-06 MED ORDER — PROPOFOL 10 MG/ML IV BOLUS
INTRAVENOUS | Status: DC | PRN
  Administered 2020-05-06 (×2): 30 mg via INTRAVENOUS
  Administered 2020-05-06 (×2): 20 mg via INTRAVENOUS
  Administered 2020-05-06: 150 mg via INTRAVENOUS
  Administered 2020-05-06: 20 mg via INTRAVENOUS
  Administered 2020-05-06: 30 mg via INTRAVENOUS

## 2020-05-06 MED ORDER — LIDOCAINE HCL (CARDIAC) PF 100 MG/5ML IV SOSY
PREFILLED_SYRINGE | INTRAVENOUS | Status: DC | PRN
  Administered 2020-05-06: 50 mg via INTRAVENOUS

## 2020-05-06 MED ORDER — LACTATED RINGERS IV SOLN: INTRAVENOUS | Status: DC

## 2020-05-06 SURGICAL SUPPLY — 8 items
GOWN CVR UNV OPN BCK APRN NK (MISCELLANEOUS) ×2 IMPLANT
GOWN ISOL THUMB LOOP REG UNIV (MISCELLANEOUS) ×4
KIT PRC NS LF DISP ENDO (KITS) ×1 IMPLANT
KIT PROCEDURE OLYMPUS (KITS) ×2
MANIFOLD NEPTUNE II (INSTRUMENTS) ×2 IMPLANT
SNARE COLD EXACTO (MISCELLANEOUS) ×2 IMPLANT
TRAP ETRAP POLY (MISCELLANEOUS) ×2 IMPLANT
WATER STERILE IRR 250ML POUR (IV SOLUTION) ×2 IMPLANT

## 2020-05-06 NOTE — H&P (Signed)
   Lucilla Lame, MD [address] [place], [place] [address] Phone: [phone] Fax : [phone]  Primary Care Physician:  Sofie Hartigan, MD Primary Gastroenterologist:  Dr. Allen Norris  Pre-Procedure History & Physical: HPI:  Kristin Hutchinson is a 50 y.o. female is here for a screening colonoscopy.   Past Medical History:  Diagnosis Date  . Blood disorder   . COVID-19 12/2018  . Hereditary hemochromatosis ([place])   . Wears contact lenses     Past Surgical History:  Procedure Laterality Date  . BREAST BIOPSY Right 12/03/2009   neg    Prior to Admission medications   Medication Sig Start Date End Date Taking? Authorizing Provider  Cholecalciferol (VITAMIN D3 PO) Take by mouth daily.   Yes [provider]  folic acid (FOLVITE) 1 MG tablet Take 1 mg by mouth daily.   Yes [provider]  Menaquinone-7 (VITAMIN K2 PO) Take by mouth.   Yes [provider]  vitamin B-12 (CYANOCOBALAMIN) 1000 MCG tablet Take 1,000 mcg by mouth daily.   Yes [provider]    Allergies as of 04/20/2020  . (No Known Allergies)    Family History  Problem Relation Age of Onset  . Breast cancer Mother 5  . Hypertension Mother   . Diabetes Father   . Hypertension Father     Social History   Socioeconomic History  . Marital status: Married    Spouse name: Not on file  . Number of children: Not on file  . Years of education: Not on file  . Highest education level: Not on file  Occupational History  . Not on file  Tobacco Use  . Smoking status: Never Smoker  . Smokeless tobacco: Never Used  Vaping Use  . Vaping Use: Never used  Substance and Sexual Activity  . Alcohol use: Yes    Alcohol/week: 5.0 standard drinks    Types: 5 Cans of beer per week    Comment: weekends  . Drug use: No  . Sexual activity: Yes    Birth control/protection: I.U.D.  Other Topics Concern  . Not on file  Social History Narrative  . Not on file   Social  Determinants of Health   Financial Resource Strain: Not on file  Food Insecurity: Not on file  Transportation Needs: Not on file  Physical Activity: Not on file  Stress: Not on file  Social Connections: Not on file  Intimate Partner Violence: Not on file    Review of Systems: See HPI, otherwise negative ROS  Physical Exam: Ht 5\' 7"  (1.702 m)   Wt 68 kg   LMP 02/17/2020 (Approximate) Comment: preg test neg  BMI 23.49 kg/m  General:   Alert,  pleasant and cooperative in NAD Head:  Normocephalic and atraumatic. Neck:  Supple; no masses or thyromegaly. Lungs:  Clear throughout to auscultation.    Heart:  Regular rate and rhythm. Abdomen:  Soft, nontender and nondistended. Normal bowel sounds, without guarding, and without rebound.   Neurologic:  Alert and  oriented x4;  grossly normal neurologically.  Impression/Plan: Kristin Hutchinson is now here to undergo a screening colonoscopy.  Risks, benefits, and alternatives regarding colonoscopy have been reviewed with the patient.  Questions have been answered.  All parties agreeable.

## 2020-05-06 NOTE — Op Note (Signed)
[facility] Gastroenterology Patient Name: Kristin Hutchinson Procedure Date: 05/06/2020 9:27 AM MRN: [id number] Account #: [id number] Date of Birth: 27-Jun-1970 Admit Type: Outpatient Age: 50 Room: [facility] OR ROOM 01 Gender: Female Note Status: Finalized Procedure:             Colonoscopy Indications:           Screening for colorectal malignant neoplasm Providers:             Lucilla Lame MD, MD Referring MD:          Sofie Hartigan (Referring MD) Medicines:             Propofol per Anesthesia Complications:         No immediate complications. Procedure:             Pre-Anesthesia Assessment:                        - Prior to the procedure, a History and Physical was                         performed, and patient medications and allergies were                         reviewed. The patient's tolerance of previous                         anesthesia was also reviewed. The risks and benefits                         of the procedure and the sedation options and risks                         were discussed with the patient. All questions were                         answered, and informed consent was obtained. Prior                         Anticoagulants: The patient has taken no previous                         anticoagulant or antiplatelet agents. ASA Grade                         Assessment: II - A patient with mild systemic disease.                         After reviewing the risks and benefits, the patient                         was deemed in satisfactory condition to undergo the                         procedure.                        After obtaining informed consent, the colonoscope was  passed under direct vision. Throughout the procedure,                         the patient's blood pressure, pulse, and oxygen                         saturations were monitored continuously. The was                         introduced through the anus and  advanced to the the                         cecum, identified by appendiceal orifice and ileocecal                         valve. The colonoscopy was performed without                         difficulty. The patient tolerated the procedure well.                         The quality of the bowel preparation was excellent. Findings:      The perianal and digital rectal examinations were normal.      A 4 mm polyp was found in the rectum. The polyp was sessile. The polyp       was removed with a cold snare. Resection and retrieval were complete.      Non-bleeding internal hemorrhoids were found during retroflexion. The       hemorrhoids were Grade I (internal hemorrhoids that do not prolapse). Impression:            - One 4 mm polyp in the rectum, removed with a cold                         snare. Resected and retrieved.                        - Non-bleeding internal hemorrhoids. Recommendation:        - Discharge patient to home.                        - Resume previous diet.                        - Continue present medications.                        - Repeat colonoscopy in 7 years if polyp adenoma and                         10 years if hyperplastic Procedure Code(s):     --- Professional ---                        [phone], Colonoscopy, flexible; with removal of                         tumor(s), polyp(s), or other lesion(s) by snare  technique Diagnosis Code(s):     --- Professional ---                        Z12.11, Encounter for screening for malignant neoplasm                         of colon                        K62.1, Rectal polyp CPT copyright 2019 American Medical Association. All rights reserved. The codes documented in this report are preliminary and upon coder review may  be revised to meet current compliance requirements. Lucilla Lame MD, MD 05/06/2020 9:57:29 AM This report has been signed electronically. Number of Addenda: 0 Note Initiated On:  05/06/2020 9:27 AM Scope Withdrawal Time: 0 hours 11 minutes 30 seconds  Total Procedure Duration: 0 hours 13 minutes 57 seconds  Estimated Blood Loss:  Estimated blood loss: none.      [facility]

## 2020-05-06 NOTE — Anesthesia Preprocedure Evaluation (Signed)
Anesthesia Evaluation  Patient identified by MRN, date of birth, ID band Patient awake    Reviewed: Allergy & Precautions, NPO status , Patient's Chart, lab work & pertinent test results  Airway Mallampati: II  TM Distance: >3 FB     Dental   Pulmonary neg pulmonary ROS, neg recent URI,    breath sounds clear to auscultation       Cardiovascular negative cardio ROS   Rhythm:Regular Rate:Normal     Neuro/Psych    GI/Hepatic negative GI ROS,   Endo/Other    Renal/GU      Musculoskeletal   Abdominal   Peds  Hematology Hemochromatosis    Anesthesia Other Findings   Reproductive/Obstetrics                             Anesthesia Physical Anesthesia Plan  ASA: II  Anesthesia Plan: General   Post-op Pain Management:    Induction: Intravenous  PONV Risk Score and Plan: Propofol infusion, TIVA and Treatment may vary due to age or medical condition  Airway Management Planned: Natural Airway and Nasal Cannula  Additional Equipment:   Intra-op Plan:   Post-operative Plan:   Informed Consent: I have reviewed the patients History and Physical, chart, labs and discussed the procedure including the risks, benefits and alternatives for the proposed anesthesia with the patient or authorized representative who has indicated his/her understanding and acceptance.       Plan Discussed with: CRNA  Anesthesia Plan Comments:         Anesthesia Quick Evaluation

## 2020-05-06 NOTE — Transfer of Care (Signed)
Immediate Anesthesia Transfer of Care Note  Patient: Kristin Hutchinson  Procedure(s) Performed: COLONOSCOPY WITH BIOPSY (N/A Rectum) POLYPECTOMY (N/A Rectum)  Patient Location: PACU  Anesthesia Type: General  Level of Consciousness: awake, alert  and patient cooperative  Airway and Oxygen Therapy: Patient Spontanous Breathing and Patient connected to supplemental oxygen  Post-op Assessment: Post-op Vital signs reviewed, Patient's Cardiovascular Status Stable, Respiratory Function Stable, Patent Airway and No signs of Nausea or vomiting  Post-op Vital Signs: Reviewed and stable  Complications: No complications documented.

## 2020-05-06 NOTE — Anesthesia Postprocedure Evaluation (Signed)
Anesthesia Post Note  Patient: Kristin Hutchinson  Procedure(s) Performed: COLONOSCOPY WITH BIOPSY (N/A Rectum) POLYPECTOMY (N/A Rectum)     Patient location during evaluation: PACU Anesthesia Type: General Level of consciousness: awake Pain management: pain level controlled Vital Signs Assessment: post-procedure vital signs reviewed and stable Respiratory status: respiratory function stable Cardiovascular status: stable Postop Assessment: no signs of nausea or vomiting Anesthetic complications: no   No complications documented.  Veda Canning

## 2020-05-09 ENCOUNTER — Encounter: Payer: Self-pay | Admitting: Gastroenterology

## 2020-05-09 LAB — SURGICAL PATHOLOGY

## 2020-05-31 ENCOUNTER — Inpatient Hospital Stay: Payer: 59

## 2020-05-31 ENCOUNTER — Telehealth: Payer: Self-pay | Admitting: *Deleted

## 2020-05-31 ENCOUNTER — Other Ambulatory Visit: Payer: Self-pay

## 2020-05-31 LAB — HEMOGLOBIN AND HEMATOCRIT, BLOOD
HCT: 44.2 % (ref 36.0–46.0)
Hemoglobin: 15.7 g/dL — ABNORMAL HIGH (ref 12.0–15.0)

## 2020-05-31 LAB — FERRITIN: Ferritin: 124 ng/mL (ref 11–307)

## 2020-05-31 NOTE — Telephone Encounter (Signed)
Patient needs a phlebotomy - please schedule on mebane's schedule Thursday 3/31 at 2:30 and also schedule patient's b12 on the same day. She has an apt on Monday for the b12 injection. We can cnl this apt.

## 2020-05-31 NOTE — Telephone Encounter (Signed)
-----   Message from Cephus Richer sent at 05/31/2020 10:38 AM EDT ----- Regarding: Lab results Hey pt would like a call to let her know if she will need phlebotomy and if her labs were better.  Thanks

## 2020-06-01 NOTE — Telephone Encounter (Signed)
Please arrange for phlebotomy/b12 on 3/31 230 pm

## 2020-06-02 ENCOUNTER — Other Ambulatory Visit: Payer: Self-pay

## 2020-06-02 ENCOUNTER — Inpatient Hospital Stay: Payer: 59

## 2020-06-02 VITALS — BP 118/83 | HR 86 | Resp 18

## 2020-06-02 DIAGNOSIS — R7989 Other specified abnormal findings of blood chemistry: Secondary | ICD-10-CM

## 2020-06-02 MED ORDER — CYANOCOBALAMIN 1000 MCG/ML IJ SOLN
1000.0000 ug | Freq: Once | INTRAMUSCULAR | Status: AC
  Administered 2020-06-02: 1000 ug via INTRAMUSCULAR

## 2020-06-06 ENCOUNTER — Ambulatory Visit: Payer: 59

## 2020-06-28 ENCOUNTER — Other Ambulatory Visit: Payer: Self-pay

## 2020-06-28 ENCOUNTER — Ambulatory Visit: Payer: 59 | Admitting: Hematology and Oncology

## 2020-06-28 ENCOUNTER — Inpatient Hospital Stay: Payer: 59 | Attending: Oncology | Admitting: Oncology

## 2020-06-28 DIAGNOSIS — E538 Deficiency of other specified B group vitamins: Secondary | ICD-10-CM | POA: Diagnosis not present

## 2020-06-28 DIAGNOSIS — Z803 Family history of malignant neoplasm of breast: Secondary | ICD-10-CM | POA: Insufficient documentation

## 2020-06-28 DIAGNOSIS — Z8616 Personal history of COVID-19: Secondary | ICD-10-CM | POA: Insufficient documentation

## 2020-06-28 DIAGNOSIS — Z79899 Other long term (current) drug therapy: Secondary | ICD-10-CM | POA: Insufficient documentation

## 2020-06-28 LAB — COMPREHENSIVE METABOLIC PANEL
ALT: 20 U/L (ref 0–44)
AST: 22 U/L (ref 15–41)
Albumin: 3.9 g/dL (ref 3.5–5.0)
Alkaline Phosphatase: 53 U/L (ref 38–126)
Anion gap: 7 (ref 5–15)
BUN: 15 mg/dL (ref 6–20)
CO2: 25 mmol/L (ref 22–32)
Calcium: 8.6 mg/dL — ABNORMAL LOW (ref 8.9–10.3)
Chloride: 102 mmol/L (ref 98–111)
Creatinine, Ser: 0.79 mg/dL (ref 0.44–1.00)
GFR, Estimated: >60 mL/min (ref >60)
Glucose, Bld: 102 mg/dL — ABNORMAL HIGH (ref 70–99)
Potassium: 3.9 mmol/L (ref 3.5–5.1)
Sodium: 134 mmol/L — ABNORMAL LOW (ref 135–145)
Total Bilirubin: 0.7 mg/dL (ref 0.3–1.2)
Total Protein: 6.8 g/dL (ref 6.5–8.1)

## 2020-06-28 LAB — CBC WITH DIFFERENTIAL/PLATELET
Abs Immature Granulocytes: 0.02 10*3/uL (ref 0.00–0.07)
Basophils Absolute: 0 10*3/uL (ref 0.0–0.1)
Basophils Relative: 1 %
Eosinophils Absolute: 0.1 10*3/uL (ref 0.0–0.5)
Eosinophils Relative: 2 %
HCT: 42.2 % (ref 36.0–46.0)
Hemoglobin: 14.7 g/dL (ref 12.0–15.0)
Immature Granulocytes: 0 %
Lymphocytes Relative: 28 %
Lymphs Abs: 1.7 10*3/uL (ref 0.7–4.0)
MCH: 33.3 pg (ref 26.0–34.0)
MCHC: 34.8 g/dL (ref 30.0–36.0)
MCV: 95.7 fL (ref 80.0–100.0)
Monocytes Absolute: 0.7 10*3/uL (ref 0.1–1.0)
Monocytes Relative: 10 %
Neutro Abs: 3.7 10*3/uL (ref 1.7–7.7)
Neutrophils Relative %: 59 %
Platelets: 207 10*3/uL (ref 150–400)
RBC: 4.41 MIL/uL (ref 3.87–5.11)
RDW: 11.8 % (ref 11.5–15.5)
WBC: 6.3 10*3/uL (ref 4.0–10.5)
nRBC: 0 % (ref 0.0–0.2)

## 2020-06-28 LAB — FERRITIN: Ferritin: 107 ng/mL (ref 11–307)

## 2020-06-29 ENCOUNTER — Inpatient Hospital Stay: Payer: 59 | Admitting: Oncology

## 2020-06-29 ENCOUNTER — Inpatient Hospital Stay: Payer: 59

## 2020-06-29 ENCOUNTER — Encounter: Payer: Self-pay | Admitting: Oncology

## 2020-06-29 VITALS — BP 126/79 | HR 98 | Resp 18

## 2020-06-29 DIAGNOSIS — R7989 Other specified abnormal findings of blood chemistry: Secondary | ICD-10-CM

## 2020-06-29 MED ORDER — CYANOCOBALAMIN 1000 MCG/ML IJ SOLN
1000.0000 ug | Freq: Once | INTRAMUSCULAR | Status: AC
  Administered 2020-06-29: 1000 ug via INTRAMUSCULAR

## 2020-06-29 NOTE — Progress Notes (Signed)
[facility]  [address] [place], [place] [address] Phone: [phone]  Fax: [phone]   Clinic Day:  06/29/2020  Referring physician: Sofie Hartigan, MD  Chief Complaint: Kristin Hutchinson is a 50 y.o. 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  HPI: The patient was last seen in the hematology clinic on 03/23/20. At that time, she has been "good." She has not had any side effects from her phlebotomies. She continued taking vitamin B12 1000 mcg daily and folic acid 1 mg daily.   Additional labs from 02/24/2020: TSH 1.157. Reticulocyte count 1.8%. Vitamin B12 was 94 (low) and folate 5.1 (low).  She was contacted to begin B12 1000 mcg a day and folic acid 1 mg a day.  Her last phlebotomy was on 06/02/2020.   She had an ultrasound of her abdomen on 05/05/2020 which did not reveal any hepatic lesions.  She had a screening bilateral mammogram on 04/26/2020 which was negative.  During the interim, she has done well.  She denies any symptoms.  She has never had any symptoms.   Past Medical History:  Diagnosis Date  . Blood disorder   . COVID-19 12/2018  . Hereditary hemochromatosis ([place])   . Wears contact lenses     Past Surgical History:  Procedure Laterality Date  . BREAST BIOPSY Right 12/03/2009   neg  . COLONOSCOPY WITH PROPOFOL N/A 05/06/2020   Procedure: COLONOSCOPY WITH BIOPSY;  Surgeon: Lucilla Lame, MD;  Location: [place];  Service: Endoscopy;  Laterality: N/A;  priority 4  . POLYPECTOMY N/A 05/06/2020   Procedure: POLYPECTOMY;  Surgeon: Lucilla Lame, MD;  Location: [place];  Service: Endoscopy;  Laterality: N/A;    Family History  Problem Relation Age of Onset  . Breast cancer Mother 42  . Hypertension Mother   . Diabetes Father   . Hypertension Father     Social History:  reports that she has never smoked. She has never used smokeless tobacco. She reports current alcohol use  of about 5.0 standard drinks of alcohol per week. She reports that she does not use drugs. She drinks beer socially. The patient does not use tobacco. She denies exposure to radiation or toxins. Her mother is Kristin Hutchinson.  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 The patient is alone today.  Allergies: No Known Allergies  Current Medications: Current Outpatient Medications  Medication Sig Dispense Refill  . Cholecalciferol (VITAMIN D3 PO) Take by mouth daily.    . folic acid (FOLVITE) 1 MG tablet Take 1 mg by mouth daily.    . Menaquinone-7 (VITAMIN K2 PO) Take by mouth.    [place] Gunning BOWEL PREP KIT 17.5-3.13-1.6 GM/177ML SOLN Take by mouth.    . vitamin B-12 (CYANOCOBALAMIN) 1000 MCG tablet Take 1,000 mcg by mouth daily. (Patient not taking: Reported on 06/29/2020)     No current facility-administered medications for this visit.   Review of Systems  Constitutional: Negative.  Negative for chills, fever, malaise/fatigue and weight loss.  HENT: Negative for congestion, ear pain and tinnitus.   Eyes: Negative.  Negative for blurred vision and double vision.  Respiratory: Negative.  Negative for cough, sputum production and shortness of breath.   Cardiovascular: Negative.  Negative for chest pain, palpitations and leg swelling.  Gastrointestinal: Negative.  Negative for abdominal pain, constipation, diarrhea, nausea and vomiting.  Genitourinary: Negative for dysuria, frequency and urgency.  Musculoskeletal:  Negative for back pain and falls.  Skin: Negative.  Negative for rash.  Neurological: Negative.  Negative for weakness and headaches.  Endo/Heme/Allergies: Negative.  Does not bruise/bleed easily.  Psychiatric/Behavioral: Negative.  Negative for depression. The patient is not nervous/anxious and does not have insomnia.    Performance status (ECOG): 0  Vitals Blood pressure (!) 146/91, pulse 96, temperature (!) 96.5 F (35.8 C), resp. rate 16, weight  148 lb 0.6 oz (67.1 kg), SpO2 100 %.   Physical Exam Constitutional:      Appearance: She is well-developed.  HENT:     Head: Normocephalic and atraumatic.  Eyes:     Pupils: Pupils are equal, round, and reactive to light.  Cardiovascular:     Rate and Rhythm: Normal rate and regular rhythm.     Heart sounds: No murmur heard.   Pulmonary:     Effort: Pulmonary effort is normal.     Breath sounds: Normal breath sounds. No wheezing.  Abdominal:     General: Bowel sounds are normal. There is no distension.     Palpations: Abdomen is soft. There is no mass.     Tenderness: There is no abdominal tenderness.  Musculoskeletal:        General: Normal range of motion.     Cervical back: Normal range of motion.  Skin:    General: Skin is warm and dry.  Neurological:     Mental Status: She is alert and oriented to person, place, and time.  Psychiatric:        Behavior: Behavior normal.    Clinical Support on 06/28/2020  Component Date Value Ref Range Status  . Sodium 06/28/2020 134* 135 - 145 mmol/L Final  . Potassium 06/28/2020 3.9  3.5 - 5.1 mmol/L Final  . Chloride 06/28/2020 102  98 - 111 mmol/L Final  . CO2 06/28/2020 25  22 - 32 mmol/L Final  . Glucose, Bld 06/28/2020 102* 70 - 99 mg/dL Final   Glucose reference range applies only to samples taken after fasting for at least 8 hours.  . BUN 06/28/2020 15  6 - 20 mg/dL Final  . Creatinine, Ser 06/28/2020 0.79  0.44 - 1.00 mg/dL Final  . Calcium 06/28/2020 8.6* 8.9 - 10.3 mg/dL Final  . Total Protein 06/28/2020 6.8  6.5 - 8.1 g/dL Final  . Albumin 06/28/2020 3.9  3.5 - 5.0 g/dL Final  . AST 06/28/2020 22  15 - 41 U/L Final  . ALT 06/28/2020 20  0 - 44 U/L Final  . Alkaline Phosphatase 06/28/2020 53  38 - 126 U/L Final  . Total Bilirubin 06/28/2020 0.7  0.3 - 1.2 mg/dL Final  . GFR, Estimated 06/28/2020 >60  >60 mL/min Final   Comment: (NOTE) Calculated using the CKD-EPI Creatinine Equation (2021)   . Anion gap 06/28/2020  7  5 - 15 Final   Performed at [facility], [address], [place], [place] [address]  . WBC 06/28/2020 6.3  4.0 - 10.5 K/uL Final  . RBC 06/28/2020 4.41  3.87 - 5.11 MIL/uL Final  . Hemoglobin 06/28/2020 14.7  12.0 - 15.0 g/dL Final  . HCT 06/28/2020 42.2  36.0 - 46.0 % Final  . MCV 06/28/2020 95.7  80.0 - 100.0 fL Final  . MCH 06/28/2020 33.3  26.0 - 34.0 pg Final  . MCHC 06/28/2020 34.8  30.0 - 36.0 g/dL Final  . RDW 06/28/2020 11.8  11.5 - 15.5 % Final  . Platelets 06/28/2020 207  150 -  400 K/uL Final  . nRBC 06/28/2020 0.0  0.0 - 0.2 % Final  . Neutrophils Relative % 06/28/2020 59  % Final  . Neutro Abs 06/28/2020 3.7  1.7 - 7.7 K/uL Final  . Lymphocytes Relative 06/28/2020 28  % Final  . Lymphs Abs 06/28/2020 1.7  0.7 - 4.0 K/uL Final  . Monocytes Relative 06/28/2020 10  % Final  . Monocytes Absolute 06/28/2020 0.7  0.1 - 1.0 K/uL Final  . Eosinophils Relative 06/28/2020 2  % Final  . Eosinophils Absolute 06/28/2020 0.1  0.0 - 0.5 K/uL Final  . Basophils Relative 06/28/2020 1  % Final  . Basophils Absolute 06/28/2020 0.0  0.0 - 0.1 K/uL Final  . Immature Granulocytes 06/28/2020 0  % Final  . Abs Immature Granulocytes 06/28/2020 0.02  0.00 - 0.07 K/uL Final   Performed at [facility], [address], [place], [place] [address]  . Ferritin 06/28/2020 107  11 - 307 ng/mL Final   Performed at [facility], [address] [place], [place] [address]    Assessment:  NYALA KIRCHNER is a 50 y.o. 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 Initial ferritin was 672 with an iron saturation of 93% and a TIBC of 295.4 on 09/21/2019.  Additional labs on 10/21/2019 revealed hepatitis B surface antibody was <3.1. Hepatitis B surface antigen, hepatitis B core antibody, and hepatitis C antibody were non reactive.   Ferritin has been followed: 672 on 09/21/2019, 1016 on 11/03/2019, 940 on 11/11/2019, 725 on 11/18/2019, 646 on  11/25/2019, 540 on 12/07/2019, 411 on 12/16/2019, 434 on 12/30/2019, 351 on 01/06/2020, 314 on 01/13/2020, 325 on 01/20/2020, 278 on 01/27/2020, 193 on 02/10/2020, 164 on 02/24/2020, 177 on 03/09/2020 and 114 on 03/22/2020.    She has been on a phlebotomy program (500cc) since 11/03/2019 (last 03/09/2020).  RUQ ultrasound on 11/06/2019 was unremarkable.  There was no evidence of hepatic mass.  AFP was 5.6 on 11/03/2019.   She developed B12 and folate deficiency.  B12 was 94 and folate 5.1 on 02/24/2020.  She is on oral B12 1000 mcg a day and folate 1 mg a day.  Family history is notable for hemochromatosis in her mother and sister.  Her mother also has breast cancer.  Symptomatically, she feels "good."  Denies any side effects from phlebotomies.  Exam is stable.  Plan: 1.   Review interim labs.  2.   Hereditary hemochromatosis  -She is homozygous for C282Y.  -She undergoes therapeutic phlebotomy to maintain a ferritin 50-100.  -She undergoes AFP and RUQ ultrasound for [place] surveillance.   -RUQ ultrasound on 05/05/2020 was negative.     -AFP was 5.6 (normal) on 11/03/2019.  -She is hepatitis B and C negative.  -Ferritin was 107 on 06/28/2020.  -Proceed with phlebotomy today. 3.   B12 and folate deficiency  -She was diagnosed with B12 and folate deficiency on 02/24/2020.  -Check antiparietal cell antibodies and intrinsic factor antibodies to rule out pernicious anemia.  -Continue oral T01 and folic acid.  -B12 level on 04/05/2020 was 234  -Folate was 13.5.  -She was started on B12 injections.  -Continue B12 injections x6.  Plan: -Proceed with phlebotomy today and B12 injection. - Return to clinic monthly for B12 injection X 6 and labs (HCT, Hgb, ferritin) and +/- phlebotomy. Will call back if she needs a phlebotomy .   I discussed the assessment and treatment plan with the patient.  The patient was provided  an opportunity to ask questions and all were answered.  The patient agreed with  the plan and demonstrated an understanding of the instructions.  The patient was advised to call back if the symptoms worsen or if the condition fails to improve as anticipated.  Greater than 50% was spent in counseling and coordination of care with this patient including but not limited to discussion of the relevant topics above (See A&P) including, but not limited to diagnosis and management of acute and chronic medical conditions.   Faythe Casa, NP 06/29/2020 10:01 AM

## 2020-06-29 NOTE — Patient Instructions (Signed)

## 2020-07-27 ENCOUNTER — Inpatient Hospital Stay: Payer: 59

## 2020-07-28 ENCOUNTER — Inpatient Hospital Stay: Payer: 59

## 2020-08-03 ENCOUNTER — Inpatient Hospital Stay: Payer: 59

## 2020-08-03 ENCOUNTER — Other Ambulatory Visit: Payer: Self-pay | Admitting: Oncology

## 2020-08-03 ENCOUNTER — Inpatient Hospital Stay: Payer: 59 | Attending: Oncology

## 2020-08-03 ENCOUNTER — Other Ambulatory Visit: Payer: Self-pay

## 2020-08-03 DIAGNOSIS — E538 Deficiency of other specified B group vitamins: Secondary | ICD-10-CM

## 2020-08-03 LAB — HEMOGLOBIN: Hemoglobin: 15.4 g/dL — ABNORMAL HIGH (ref 12.0–15.0)

## 2020-08-03 LAB — HEMATOCRIT: HCT: 44.3 % (ref 36.0–46.0)

## 2020-08-03 LAB — FERRITIN: Ferritin: 105 ng/mL (ref 11–307)

## 2020-08-03 MED ORDER — CYANOCOBALAMIN 1000 MCG/ML IJ SOLN: 1000.0000 ug | Freq: Once | INTRAMUSCULAR | Status: DC

## 2020-08-03 MED ORDER — CYANOCOBALAMIN 1000 MCG/ML IJ SOLN
1000.0000 ug | Freq: Once | INTRAMUSCULAR | Status: AC
  Administered 2020-08-03: 1000 ug via INTRAMUSCULAR

## 2020-08-04 ENCOUNTER — Inpatient Hospital Stay: Payer: 59

## 2020-08-04 VITALS — BP 131/90 | HR 94 | Temp 97.0°F | Resp 18

## 2020-08-04 DIAGNOSIS — R7989 Other specified abnormal findings of blood chemistry: Secondary | ICD-10-CM

## 2020-08-24 ENCOUNTER — Inpatient Hospital Stay: Payer: 59

## 2020-08-24 ENCOUNTER — Other Ambulatory Visit: Payer: Self-pay

## 2020-08-24 DIAGNOSIS — E538 Deficiency of other specified B group vitamins: Secondary | ICD-10-CM

## 2020-08-24 LAB — HEMATOCRIT: HCT: 42.4 % (ref 36.0–46.0)

## 2020-08-24 LAB — HEMOGLOBIN: Hemoglobin: 15.1 g/dL — ABNORMAL HIGH (ref 12.0–15.0)

## 2020-08-24 MED ORDER — CYANOCOBALAMIN 1000 MCG/ML IJ SOLN
1000.0000 ug | Freq: Once | INTRAMUSCULAR | Status: AC
  Administered 2020-08-24: 1000 ug via INTRAMUSCULAR

## 2020-08-25 ENCOUNTER — Inpatient Hospital Stay: Payer: 59

## 2020-08-25 LAB — FERRITIN: Ferritin: 87 ng/mL (ref 11–307)

## 2020-08-26 ENCOUNTER — Inpatient Hospital Stay: Payer: 59

## 2020-09-21 ENCOUNTER — Inpatient Hospital Stay: Payer: 59 | Attending: Oncology

## 2020-09-21 ENCOUNTER — Other Ambulatory Visit: Payer: Self-pay

## 2020-09-21 DIAGNOSIS — E538 Deficiency of other specified B group vitamins: Secondary | ICD-10-CM | POA: Insufficient documentation

## 2020-09-21 DIAGNOSIS — Z79899 Other long term (current) drug therapy: Secondary | ICD-10-CM | POA: Insufficient documentation

## 2020-09-21 LAB — HEMOGLOBIN: Hemoglobin: 15.3 g/dL — ABNORMAL HIGH (ref 12.0–15.0)

## 2020-09-21 LAB — HEMATOCRIT: HCT: 43.4 % (ref 36.0–46.0)

## 2020-09-21 LAB — FERRITIN: Ferritin: 83 ng/mL (ref 11–307)

## 2020-09-21 MED ORDER — CYANOCOBALAMIN 1000 MCG/ML IJ SOLN: 1000.0000 ug | Freq: Once | INTRAMUSCULAR | Status: DC

## 2020-09-21 MED ORDER — CYANOCOBALAMIN 1000 MCG/ML IJ SOLN
1000.0000 ug | Freq: Once | INTRAMUSCULAR | Status: AC
Start: 2020-09-21 — End: 2020-09-21
  Administered 2020-09-21: 1000 ug via INTRAMUSCULAR

## 2020-09-22 ENCOUNTER — Inpatient Hospital Stay: Payer: 59

## 2020-10-18 NOTE — Progress Notes (Signed)
Okay, will do. She has a lab and inj appt in Sept. Cancel those or keep them?

## 2020-10-18 NOTE — Progress Notes (Signed)
Hey! She had labs drawn like 3 weeks ago and she will have more drawn this week. Look like her ferritin in right where it should be. She doesn't need aphelbotomy. She probably could be pushed out to October or so.   Faythe Casa, NP 10/18/2020 1:50 PM

## 2020-10-19 ENCOUNTER — Other Ambulatory Visit: Payer: Self-pay

## 2020-10-19 ENCOUNTER — Telehealth: Payer: Self-pay

## 2020-10-19 ENCOUNTER — Inpatient Hospital Stay: Payer: 59 | Attending: Oncology

## 2020-10-19 ENCOUNTER — Inpatient Hospital Stay: Payer: 59

## 2020-10-19 DIAGNOSIS — E538 Deficiency of other specified B group vitamins: Secondary | ICD-10-CM | POA: Diagnosis not present

## 2020-10-19 DIAGNOSIS — R7989 Other specified abnormal findings of blood chemistry: Secondary | ICD-10-CM

## 2020-10-19 LAB — HEMOGLOBIN: Hemoglobin: 14.9 g/dL (ref 12.0–15.0)

## 2020-10-19 LAB — HEMATOCRIT: HCT: 42.2 % (ref 36.0–46.0)

## 2020-10-19 LAB — FERRITIN: Ferritin: 93 ng/mL (ref 11–307)

## 2020-10-19 MED ORDER — CYANOCOBALAMIN 1000 MCG/ML IJ SOLN
1000.0000 ug | Freq: Once | INTRAMUSCULAR | Status: AC
  Administered 2020-10-19: 1000 ug via INTRAMUSCULAR

## 2020-10-19 NOTE — Telephone Encounter (Signed)
Pt is aware of not needing a phlebotomy and should keep her future appt that are already schedule for Sept and Oct.

## 2020-10-19 NOTE — Progress Notes (Signed)
Yes she can keep her b12 injection. That's fine.  Faythe Casa, NP 10/19/2020 11:16 AM

## 2020-10-19 NOTE — Telephone Encounter (Signed)
-----   Message from Jacquelin Hawking, NP sent at 10/18/2020  1:51 PM EDT ----- Marykay Lex! She had labs drawn like 3 weeks ago and she will have more drawn this week. Look like her ferritin in right where it should be. She doesn't need aphelbotomy. She probably could be pushed out to October or so.   Faythe Casa, NP 10/18/2020 1:50 PM

## 2020-10-19 NOTE — Progress Notes (Signed)
Whats the injection for?  Faythe Casa, NP 10/19/2020 11:15 AM

## 2020-10-20 ENCOUNTER — Inpatient Hospital Stay: Payer: 59

## 2020-11-16 ENCOUNTER — Inpatient Hospital Stay: Payer: 59

## 2020-11-16 ENCOUNTER — Other Ambulatory Visit: Payer: Self-pay

## 2020-11-16 ENCOUNTER — Inpatient Hospital Stay: Payer: 59 | Attending: Oncology

## 2020-11-16 ENCOUNTER — Telehealth: Payer: Self-pay | Admitting: *Deleted

## 2020-11-16 DIAGNOSIS — E538 Deficiency of other specified B group vitamins: Secondary | ICD-10-CM

## 2020-11-16 LAB — HEMOGLOBIN: Hemoglobin: 15.6 g/dL — ABNORMAL HIGH (ref 12.0–15.0)

## 2020-11-16 LAB — HEMATOCRIT: HCT: 43.6 % (ref 36.0–46.0)

## 2020-11-16 LAB — FERRITIN: Ferritin: 108 ng/mL (ref 11–307)

## 2020-11-16 MED ORDER — CYANOCOBALAMIN 1000 MCG/ML IJ SOLN
1000.0000 ug | Freq: Once | INTRAMUSCULAR | Status: AC
  Administered 2020-11-16: 1000 ug via INTRAMUSCULAR

## 2020-11-16 NOTE — Telephone Encounter (Signed)
Spoke to patient via telephone re: ferritin results=108. She will need to come in tomorrow for phlebotomy as scheduled. Patient verbalized understanding.

## 2020-11-17 ENCOUNTER — Inpatient Hospital Stay: Payer: 59

## 2020-11-17 DIAGNOSIS — E538 Deficiency of other specified B group vitamins: Secondary | ICD-10-CM | POA: Diagnosis not present

## 2020-11-17 DIAGNOSIS — R7989 Other specified abnormal findings of blood chemistry: Secondary | ICD-10-CM

## 2020-12-13 ENCOUNTER — Other Ambulatory Visit: Payer: Self-pay

## 2020-12-13 ENCOUNTER — Inpatient Hospital Stay: Payer: 59 | Attending: Oncology

## 2020-12-13 DIAGNOSIS — E538 Deficiency of other specified B group vitamins: Secondary | ICD-10-CM | POA: Diagnosis present

## 2020-12-13 LAB — HEMOGLOBIN: Hemoglobin: 14.6 g/dL (ref 12.0–15.0)

## 2020-12-13 LAB — HEMATOCRIT: HCT: 41.4 % (ref 36.0–46.0)

## 2020-12-13 LAB — FERRITIN: Ferritin: 57 ng/mL (ref 11–307)

## 2020-12-14 ENCOUNTER — Ambulatory Visit: Payer: 59

## 2020-12-14 ENCOUNTER — Ambulatory Visit: Payer: 59 | Admitting: Oncology

## 2020-12-14 ENCOUNTER — Encounter: Payer: Self-pay | Admitting: Oncology

## 2020-12-14 ENCOUNTER — Inpatient Hospital Stay: Payer: 59 | Attending: Oncology | Admitting: Oncology

## 2020-12-14 ENCOUNTER — Inpatient Hospital Stay: Payer: 59

## 2020-12-14 DIAGNOSIS — E538 Deficiency of other specified B group vitamins: Secondary | ICD-10-CM

## 2020-12-14 DIAGNOSIS — R7989 Other specified abnormal findings of blood chemistry: Secondary | ICD-10-CM

## 2020-12-14 MED ORDER — "SYRINGE 25G X 1"" 3 ML MISC": 3.0000 mL | 0 refills | Status: DC

## 2020-12-14 MED ORDER — CYANOCOBALAMIN 1000 MCG/ML IJ SOLN
1000.0000 ug | Freq: Once | INTRAMUSCULAR | Status: DC
  Administered 2020-12-14: 1000 ug via INTRAMUSCULAR

## 2020-12-14 MED ORDER — CYANOCOBALAMIN 1000 MCG/ML IJ SOLN: 1000.0000 ug | INTRAMUSCULAR | 0 refills | Status: DC

## 2020-12-14 MED ORDER — CYANOCOBALAMIN 1000 MCG/ML IJ SOLN: 1000.0000 ug | Freq: Once | INTRAMUSCULAR | Status: AC

## 2020-12-18 ENCOUNTER — Encounter: Payer: Self-pay | Admitting: Oncology

## 2020-12-18 NOTE — Progress Notes (Signed)
Hematology/Oncology Consult note [facility]  [phone] [phone]  Patient Care Team: Sofie Hartigan, MD as PCP - General (Family Medicine) Sindy Guadeloupe, MD as Consulting Physician (Hematology and Oncology)   Name of the patient: Kristin Hutchinson  [id number]  [birth year]   Date of visit: 12/18/20  Diagnosis-history of hereditary hemochromatosis  Chief complaint/ Reason for visit-follow-up of hereditary hemochromatosis for possible phlebotomy   Heme/Onc history: patient is a 50 year old female diagnosed with hereditary hemochromatosis and homozygosity for C282Y.  This was detected after her mother was diagnosed with hereditary hemochromatosis.  Hepatitis B and C testing negative.  She has required periodic phlebotomies in the past and the goal ferritin is to keep it less than 100.  No known baseline cirrhosis.  Interval history-patient is doing well and currently denies any complaints at this time.  ECOG PS- 0 Pain scale- 0   Review of systems- Review of Systems  Constitutional:  Negative for chills, fever, malaise/fatigue and weight loss.  HENT:  Negative for congestion, ear discharge and nosebleeds.   Eyes:  Negative for blurred vision.  Respiratory:  Negative for cough, hemoptysis, sputum production, shortness of breath and wheezing.   Cardiovascular:  Negative for chest pain, palpitations, orthopnea and claudication.  Gastrointestinal:  Negative for abdominal pain, blood in stool, constipation, diarrhea, heartburn, melena, nausea and vomiting.  Genitourinary:  Negative for dysuria, flank pain, frequency, hematuria and urgency.  Musculoskeletal:  Negative for back pain, joint pain and myalgias.  Skin:  Negative for rash.  Neurological:  Negative for dizziness, tingling, focal weakness, seizures, weakness and headaches.  Endo/Heme/Allergies:  Does not bruise/bleed easily.  Psychiatric/Behavioral:  Negative for depression and  suicidal ideas. The patient does not have insomnia.       No Known Allergies   Past Medical History:  Diagnosis Date   Blood disorder    COVID-19 12/2018   Hereditary hemochromatosis ([place])    Wears contact lenses      Past Surgical History:  Procedure Laterality Date   BREAST BIOPSY Right 12/03/2009   neg   COLONOSCOPY WITH PROPOFOL N/A 05/06/2020   Procedure: COLONOSCOPY WITH BIOPSY;  Surgeon: Lucilla Lame, MD;  Location: [place];  Service: Endoscopy;  Laterality: N/A;  priority 4   POLYPECTOMY N/A 05/06/2020   Procedure: POLYPECTOMY;  Surgeon: Lucilla Lame, MD;  Location: [place];  Service: Endoscopy;  Laterality: N/A;    Social History   Socioeconomic History   Marital status: Married    Spouse name: Not on file   Number of children: Not on file   Years of education: Not on file   Highest education level: Not on file  Occupational History   Not on file  Tobacco Use   Smoking status: Never   Smokeless tobacco: Never  Vaping Use   Vaping Use: Never used  Substance and Sexual Activity   Alcohol use: Yes    Alcohol/week: 5.0 standard drinks    Types: 5 Cans of beer per week    Comment: weekends   Drug use: No   Sexual activity: Yes    Birth control/protection: I.U.D.  Other Topics Concern   Not on file  Social History Narrative   Not on file   Social Determinants of Health   Financial Resource Strain: Not on file  Food Insecurity: Not on file  Transportation Needs: Not on file  Physical Activity: Not on file  Stress: Not on file  Social Connections: Not on  file  Intimate Partner Violence: Not on file    Family History  Problem Relation Age of Onset   Breast cancer Mother 53   Hypertension Mother    Diabetes Father    Hypertension Father      Current Outpatient Medications:    cyanocobalamin (,VITAMIN B-12,) 1000 MCG/ML injection, Inject 1 mL (1,000 mcg total) into the muscle every 30 (thirty) days., Disp: 12 mL, Rfl: 0    folic acid (FOLVITE) 1 MG tablet, Take 1 mg by mouth daily., Disp: , Rfl:    Menaquinone-7 (VITAMIN K2 PO), Take by mouth., Disp: , Rfl:    Multiple Vitamin (MULTI-VITAMIN) tablet, Take 1 tablet by mouth daily., Disp: , Rfl:    Syringe/Needle, Disp, (SYRINGE 3CC/25GX1") 25G X 1" 3 ML MISC, 3 mLs by Does not apply route every 30 (thirty) days., Disp: 12 each, Rfl: 0   Cholecalciferol (VITAMIN D3 PO), Take by mouth daily. (Patient not taking: Reported on 12/14/2020), Disp: , Rfl:    SUPREP BOWEL PREP KIT 17.5-3.13-1.6 GM/177ML SOLN, Take by mouth. (Patient not taking: Reported on 12/14/2020), Disp: , Rfl:    valACYclovir (VALTREX) 1000 MG tablet, Take 4,000 mg by mouth once. (Patient not taking: Reported on 12/14/2020), Disp: , Rfl:    vitamin B-12 (CYANOCOBALAMIN) 1000 MCG tablet, Take 1,000 mcg by mouth daily. (Patient not taking: No sig reported), Disp: , Rfl:  No current facility-administered medications for this visit.  Facility-Administered Medications Ordered in Other Visits:    cyanocobalamin ((VITAMIN B-12)) injection 1,000 mcg, 1,000 mcg, Intramuscular, Once, Sindy Guadeloupe, MD  Physical exam:  Vitals:   12/14/20 1429  BP: (!) 157/70  Pulse: 73  Resp: 16  Temp: 97.9 F (36.6 C)  SpO2: 100%  Weight: 147 lb 6.4 oz (66.9 kg)   Physical Exam Constitutional:      General: She is not in acute distress. Cardiovascular:     Rate and Rhythm: Normal rate and regular rhythm.     Heart sounds: Normal heart sounds.  Pulmonary:     Effort: Pulmonary effort is normal.     Breath sounds: Normal breath sounds.  Abdominal:     General: Bowel sounds are normal.     Palpations: Abdomen is soft.  Skin:    General: Skin is warm and dry.  Neurological:     Mental Status: She is alert and oriented to person, place, and time.     CMP Latest Ref Rng & Units 06/28/2020  Glucose 70 - 99 mg/dL 102(H)  BUN 6 - 20 mg/dL 15  Creatinine 0.44 - 1.00 mg/dL 0.79  Sodium 135 - 145 mmol/L 134(L)   Potassium 3.5 - 5.1 mmol/L 3.9  Chloride 98 - 111 mmol/L 102  CO2 22 - 32 mmol/L 25  Calcium 8.9 - 10.3 mg/dL 8.6(L)  Total Protein 6.5 - 8.1 g/dL 6.8  Total Bilirubin 0.3 - 1.2 mg/dL 0.7  Alkaline Phos 38 - 126 U/L 53  AST 15 - 41 U/L 22  ALT 0 - 44 U/L 20   CBC Latest Ref Rng & Units 12/13/2020  WBC 4.0 - 10.5 K/uL -  Hemoglobin 12.0 - 15.0 g/dL 14.6  Hematocrit 36.0 - 46.0 % 41.4  Platelets 150 - 400 K/uL -    Assessment and plan- Patient is a 50 y.o. female with a history of hereditary hemochromatosis and homozygosity for C282Y here for routine follow-up  Patient's hemoglobin is 14.6 with a ferritin of 57.  She does not require phlebotomy at this  time.  We will check CBC ferritin and iron studies CMP in 6 months and see her thereafter.  Patient does not have any baseline cirrhosis and therefore does not require [place] surveillance.  We will check interim labs in 3 months as well.  She has a history of B12 and folate deficiency which we will check in 3 months as well   Visit Diagnosis 1. Hereditary hemochromatosis ([place])   2. B12 deficiency   3. Folate deficiency      Dr. Randa Evens, MD, MPH [facility] at [facility] [phone] 12/18/2020 10:57 AM

## 2021-02-01 NOTE — Progress Notes (Unsigned)
Lab encounter was never closed.

## 2021-03-09 ENCOUNTER — Other Ambulatory Visit: Payer: Self-pay | Admitting: *Deleted

## 2021-03-09 DIAGNOSIS — R7989 Other specified abnormal findings of blood chemistry: Secondary | ICD-10-CM

## 2021-03-16 ENCOUNTER — Inpatient Hospital Stay: Payer: 59 | Attending: Oncology

## 2021-03-16 ENCOUNTER — Other Ambulatory Visit: Payer: Self-pay

## 2021-03-16 ENCOUNTER — Other Ambulatory Visit: Payer: 59

## 2021-03-16 DIAGNOSIS — R7989 Other specified abnormal findings of blood chemistry: Secondary | ICD-10-CM

## 2021-03-16 LAB — CBC WITH DIFFERENTIAL/PLATELET
Abs Immature Granulocytes: 0.01 10*3/uL (ref 0.00–0.07)
Basophils Absolute: 0 10*3/uL (ref 0.0–0.1)
Basophils Relative: 1 %
Eosinophils Absolute: 0.1 10*3/uL (ref 0.0–0.5)
Eosinophils Relative: 2 %
HCT: 43.5 % (ref 36.0–46.0)
Hemoglobin: 15.4 g/dL — ABNORMAL HIGH (ref 12.0–15.0)
Immature Granulocytes: 0 %
Lymphocytes Relative: 26 %
Lymphs Abs: 2.1 10*3/uL (ref 0.7–4.0)
MCH: 33.8 pg (ref 26.0–34.0)
MCHC: 35.4 g/dL (ref 30.0–36.0)
MCV: 95.6 fL (ref 80.0–100.0)
Monocytes Absolute: 0.7 10*3/uL (ref 0.1–1.0)
Monocytes Relative: 9 %
Neutro Abs: 5.1 10*3/uL (ref 1.7–7.7)
Neutrophils Relative %: 62 %
Platelets: 221 10*3/uL (ref 150–400)
RBC: 4.55 MIL/uL (ref 3.87–5.11)
RDW: 12.3 % (ref 11.5–15.5)
WBC: 8.1 10*3/uL (ref 4.0–10.5)
nRBC: 0 % (ref 0.0–0.2)

## 2021-03-16 LAB — IRON AND TIBC
Iron: 218 ug/dL — ABNORMAL HIGH (ref 28–170)
Saturation Ratios: 68 % — ABNORMAL HIGH (ref 10.4–31.8)
TIBC: 322 ug/dL (ref 250–450)
UIBC: 104 ug/dL

## 2021-03-16 LAB — COMPREHENSIVE METABOLIC PANEL
ALT: 19 U/L (ref 0–44)
AST: 23 U/L (ref 15–41)
Albumin: 4.2 g/dL (ref 3.5–5.0)
Alkaline Phosphatase: 64 U/L (ref 38–126)
Anion gap: 7 (ref 5–15)
BUN: 10 mg/dL (ref 6–20)
CO2: 23 mmol/L (ref 22–32)
Calcium: 8.7 mg/dL — ABNORMAL LOW (ref 8.9–10.3)
Chloride: 103 mmol/L (ref 98–111)
Creatinine, Ser: 0.66 mg/dL (ref 0.44–1.00)
GFR, Estimated: >60 mL/min (ref >60)
Glucose, Bld: 94 mg/dL (ref 70–99)
Potassium: 4.1 mmol/L (ref 3.5–5.1)
Sodium: 133 mmol/L — ABNORMAL LOW (ref 135–145)
Total Bilirubin: 1.2 mg/dL (ref 0.3–1.2)
Total Protein: 6.9 g/dL (ref 6.5–8.1)

## 2021-03-16 LAB — FOLATE: Folate: 30 ng/mL (ref >5.9)

## 2021-03-16 LAB — TSH: TSH: 1.347 u[IU]/mL (ref 0.350–4.500)

## 2021-03-16 LAB — VITAMIN B12: Vitamin B-12: 274 pg/mL (ref 180–914)

## 2021-03-16 LAB — FERRITIN: Ferritin: 66 ng/mL (ref 11–307)

## 2021-04-25 ENCOUNTER — Ambulatory Visit: Payer: 59 | Admitting: Obstetrics & Gynecology

## 2021-05-24 ENCOUNTER — Other Ambulatory Visit (HOSPITAL_COMMUNITY)
Admission: RE | Admit: 2021-05-24 | Discharge: 2021-05-24 | Disposition: A | Discharge status: Home / Self Care | Payer: 59 | Source: Ambulatory Visit | Attending: Obstetrics & Gynecology | Admitting: Obstetrics & Gynecology

## 2021-05-24 ENCOUNTER — Ambulatory Visit (INDEPENDENT_AMBULATORY_CARE_PROVIDER_SITE_OTHER): Payer: 59 | Admitting: Obstetrics & Gynecology

## 2021-05-24 ENCOUNTER — Encounter: Payer: Self-pay | Admitting: Obstetrics & Gynecology

## 2021-05-24 ENCOUNTER — Other Ambulatory Visit: Payer: Self-pay

## 2021-05-24 VITALS — BP 120/80 | Ht 67.0 in | Wt 146.0 lb

## 2021-05-24 DIAGNOSIS — Z124 Encounter for screening for malignant neoplasm of cervix: Secondary | ICD-10-CM

## 2021-05-24 DIAGNOSIS — Z01419 Encounter for gynecological examination (general) (routine) without abnormal findings: Secondary | ICD-10-CM | POA: Diagnosis not present

## 2021-05-24 DIAGNOSIS — Z1231 Encounter for screening mammogram for malignant neoplasm of breast: Secondary | ICD-10-CM | POA: Diagnosis not present

## 2021-05-24 NOTE — Patient Instructions (Addendum)
This is the new practice information you requested: ? ?Dr. [facility] ?[address] ?[address] ?[place], [place]  [address] ?[phone] ? ?PAP every three years ?Mammogram every year ?   Call [phone] to schedule at [facility] ?Colonoscopy every 10 years ?Labs yearly (with PCP) ? ?Black Cohosh, Cimicifuga racemosa oral dosage forms ?What is this medication? ?BLACK COHOSH (blak  KOH hosh) or Cimicifuga racemosa is a dietary supplement. It is promoted to relieve symptoms of menopause, such as hot flashes. The FDA has not approved this supplement for any medical use. ?This supplement may be used for other purposes; ask your health care provider or pharmacist if you have questions. ?This medicine may be used for other purposes; ask your health care provider or pharmacist if you have questions. ?What should I tell my care team before I take this medication? ?They need to know if you have any of these conditions: ?breast cancer ?cervical, ovarian or uterine cancer ?high blood pressure ?infertility ?liver disease ?menstrual changes or irregular periods ?unusual vaginal or uterine bleeding ?an unusual or allergic reaction to black cohosh, soybeans, tartrazine dye (yellow dye number 5), other medicines, foods, dyes, or preservatives ?pregnant or trying to get pregnant ?breast-feeding ?How should I use this medication? ?Take this herb by mouth with a glass of water. Follow the directions on the package labeling, or talk to your health care professional. Do not use for longer than 6 months without the advice of a health care professional. Do not use if you are pregnant or breast-feeding. Talk to your obstetrician-gynecologist or certified nurse-midwife. ?This herb is not for use in children under the age of 23 years. ?Overdosage: If you think you have taken too much of this medicine contact a poison control center or emergency room at once. ?NOTE: This medicine is only for you. Do not  share this medicine with others. ?What if I miss a dose? ?If you miss a dose, take it as soon as you can. If it is almost time for your next dose, take only that dose. Do not take double or extra doses. ?What may interact with this medication? ?atorvastatin ?cisplatin ?fertility treatments ?This list may not describe all possible interactions. Give your health care provider a list of all the medicines, herbs, non-prescription drugs, or dietary supplements you use. Also tell them if you smoke, drink alcohol, or use illegal drugs. Some items may interact with your medicine. ?What should I watch for while using this medication? ?Since this herb is derived from a plant, allergic reactions are possible. Stop using this herb if you develop a rash. You may need to see your health care professional, or inform them that this occurred. Report any unusual side effects promptly. ?If you are taking this herb for menstrual or menopausal symptoms, visit your doctor or health care professional for regular checks on your progress. You should have a complete check-up every 6 months. You will need a regular breast and pelvic exam while on this therapy. Follow the advice of your doctor or health care professional. ?Women should inform their doctor if they wish to become pregnant or think they might be pregnant. If you have any reason to think you are pregnant, stop taking this herb at once and contact your doctor or health care professional. ?Herbal or dietary supplements are not regulated like medicines. Rigid quality control standards are not required for dietary supplements. The purity and strength of these products can vary. The safety and effect  of this dietary supplement for a certain disease or illness is not well known. This product is not intended to diagnose, treat, cure or prevent any disease. ?The Food and Drug Administration suggests the following to help consumers protect themselves: ?Always read product labels and follow  directions. ?Natural does not mean a product is safe for humans to take. ?Look for products that include USP after the ingredient name. This means that the manufacturer followed the standards of the U.S. Pharmacopoeia. ?Supplements made or sold by a nationally known food or drug company are more likely to be made under tight controls. You can write to the company for more information about how the product was made. ?What side effects may I notice from receiving this medication? ?Side effects that you should report to your doctor or health care professional as soon as possible: ?allergic reactions like skin rash, itching or hives, swelling of the face, lips, or tongue ?breathing problems ?dizziness ?palpitations ?signs and symptoms of liver injury like dark yellow or brown urine; general ill feeling or flu-like symptoms; light-colored stools; loss of appetite; nausea; right upper belly pain; unusually weak or tired; yellowing of the eyes or skin ?unusual vaginal bleeding ?Side effects that usually do not require medical attention (report to your doctor or health care professional if they continue or are bothersome): ?breast tenderness ?headache ?nausea ?upset stomach ?This list may not describe all possible side effects. Call your doctor for medical advice about side effects. You may report side effects to FDA at 1-800-FDA-1088. ?Where should I keep my medication? ?Keep out of the reach of children. ?Store at room temperature between 15 and 30 degrees C (59 and 86 degrees C). Throw away any unused herb after the expiration date. ?NOTE: This sheet is a summary. It may not cover all possible information. If you have questions about this medicine, talk to your doctor, pharmacist, or health care provider. ?? 2022 Elsevier/Gold Standard (2015-08-31 00:00:00) ? ?

## 2021-05-24 NOTE — Progress Notes (Signed)
?HPI: ?     [name] is a 51 y.o. G0P0000 who LMP was in the past, she presents today for her annual examination.  The patient has no complaints today. The patient is sexually active. Herlast pap: approximate date 2020 and was normal and last mammogram: approximate date 2022 and was normal.  The patient does perform self breast exams.  There is no notable family history of breast or ovarian cancer in her family. The patient is not taking hormone replacement therapy. Patient denies post-menopausal vaginal bleeding.  She had no period for 10 mos then heavy period in Jan and Feb (not Mar).  Occas hot flashes.  The patient has regular exercise: yes. The patient denies current symptoms of depression.   ? ?GYN Hx: ?Last Colonoscopy:1 year ago. Normal.  ?Last DEXA:  never  ago.   ? ?PMHx: ?Past Medical History:  ?Diagnosis Date  ? Blood disorder   ? COVID-19 12/2018  ? Hereditary hemochromatosis ([place])   ? Wears contact lenses   ? ?Past Surgical History:  ?Procedure Laterality Date  ? BREAST BIOPSY Right 12/03/2009  ? neg  ? COLONOSCOPY WITH PROPOFOL N/A 05/06/2020  ? Procedure: COLONOSCOPY WITH BIOPSY;  Surgeon: Lucilla Lame, MD;  Location: [facility];  Service: Endoscopy;  Laterality: N/A;  priority 4  ? POLYPECTOMY N/A 05/06/2020  ? Procedure: POLYPECTOMY;  Surgeon: Lucilla Lame, MD;  Location: [facility];  Service: Endoscopy;  Laterality: N/A;  ? ?Family History  ?Problem Relation Age of Onset  ? Breast cancer Mother 30  ? Hypertension Mother   ? Diabetes Father   ? Hypertension Father   ? ?Social History  ? ?Tobacco Use  ? Smoking status: Never  ? Smokeless tobacco: Never  ?Vaping Use  ? Vaping Use: Never used  ?Substance Use Topics  ? Alcohol use: Yes  ?  Alcohol/week: 5.0 standard drinks  ?  Types: 5 Cans of beer per week  ?  Comment: weekends  ? Drug use: No  ? ? ?Current Outpatient Medications:  ?  Cholecalciferol (VITAMIN D3 PO), Take by mouth daily., Disp: , Rfl:  ?  cyanocobalamin  (,VITAMIN B-12,) 1000 MCG/ML injection, Inject 1 mL (1,000 mcg total) into the muscle every 30 (thirty) days., Disp: 12 mL, Rfl: 0 ?  folic acid (FOLVITE) 1 MG tablet, Take 1 mg by mouth daily., Disp: , Rfl:  ?  Menaquinone-7 (VITAMIN K2 PO), Take by mouth., Disp: , Rfl:  ?  Multiple Vitamin (MULTI-VITAMIN) tablet, Take 1 tablet by mouth daily., Disp: , Rfl:  ?  SUPREP BOWEL PREP KIT 17.5-3.13-1.6 GM/177ML SOLN, Take by mouth., Disp: , Rfl:  ?  Syringe/Needle, Disp, (SYRINGE 3CC/25GX1") 25G X 1" 3 ML MISC, 3 mLs by Does not apply route every 30 (thirty) days., Disp: 12 each, Rfl: 0 ?  valACYclovir (VALTREX) 1000 MG tablet, Take 4,000 mg by mouth once., Disp: , Rfl:  ?  vitamin B-12 (CYANOCOBALAMIN) 1000 MCG tablet, Take 1,000 mcg by mouth daily., Disp: , Rfl:  ?No current facility-administered medications for this visit. ? ?Facility-Administered Medications Ordered in Other Visits:  ?  cyanocobalamin ((VITAMIN B-12)) injection 1,000 mcg, 1,000 mcg, Intramuscular, Once, Sindy Guadeloupe, MD ?Allergies: Patient has no known allergies. ? ?Review of Systems  ?Constitutional:  Negative for chills, fever and malaise/fatigue.  ?HENT:  Negative for congestion, sinus pain and sore throat.   ?Eyes:  Negative for blurred vision and pain.  ?Respiratory:  Negative for cough and wheezing.   ?  Cardiovascular:  Negative for chest pain and leg swelling.  ?Gastrointestinal:  Negative for abdominal pain, constipation, diarrhea, heartburn, nausea and vomiting.  ?Genitourinary:  Negative for dysuria, frequency, hematuria and urgency.  ?Musculoskeletal:  Negative for back pain, joint pain, myalgias and neck pain.  ?Skin:  Negative for itching and rash.  ?Neurological:  Negative for dizziness, tremors and weakness.  ?Endo/Heme/Allergies:  Does not bruise/bleed easily.  ?Psychiatric/Behavioral:  Negative for depression. The patient is not nervous/anxious and does not have insomnia.   ? ?Objective: ?BP 120/80   Ht _0  (1.702 m)   Wt 146 lb  (66.2 kg)   BMI 22.87 kg/m?   ?Filed Weights  ? 05/24/21 0848  ?Weight: 146 lb (66.2 kg)  ? Body mass index is 22.87 kg/m?Marland Kitchen ?Physical Exam ?Constitutional:   ?   General: She is not in acute distress. ?   Appearance: She is well-developed.  ?Genitourinary:  ?   Bladder, rectum and urethral meatus normal.  ?   No lesions in the vagina.  ?   Right Labia: No rash, tenderness or lesions. ?   Left Labia: No tenderness, lesions or rash. ?   No vaginal bleeding.  ? ?   Right Adnexa: not tender and no mass present. ?   Left Adnexa: not tender and no mass present. ?   No cervical motion tenderness, friability, lesion or polyp.  ?   Uterus is not enlarged.  ?   No uterine mass detected. ?   Pelvic exam was performed with patient in the lithotomy position.  ?Breasts: ?   Right: No mass, skin change or tenderness.  ?   Left: No mass, skin change or tenderness.  ?HENT:  ?   Head: Normocephalic and atraumatic. No laceration.  ?   Right Ear: Hearing normal.  ?   Left Ear: Hearing normal.  ?   Mouth/Throat:  ?   Pharynx: Uvula midline.  ?Eyes:  ?   Pupils: Pupils are equal, round, and reactive to light.  ?Neck:  ?   Thyroid: No thyromegaly.  ?Cardiovascular:  ?   Rate and Rhythm: Normal rate and regular rhythm.  ?   Heart sounds: No murmur heard. ?  No friction rub. No gallop.  ?Pulmonary:  ?   Effort: Pulmonary effort is normal. No respiratory distress.  ?   Breath sounds: Normal breath sounds. No wheezing.  ?Abdominal:  ?   General: Bowel sounds are normal. There is no distension.  ?   Palpations: Abdomen is soft.  ?   Tenderness: There is no abdominal tenderness. There is no rebound.  ?Musculoskeletal:     ?   General: Normal range of motion.  ?   Cervical back: Normal range of motion and neck supple.  ?Neurological:  ?   Mental Status: She is alert and oriented to person, place, and time.  ?   Cranial Nerves: No cranial nerve deficit.  ?Skin: ?   General: Skin is warm and dry.  ?Psychiatric:     ?   Judgment: Judgment normal.   ?Vitals reviewed.  ? ? ?Assessment: Annual Exam ?1. Women's annual routine gynecological examination   ?2. Encounter for screening mammogram for malignant neoplasm of breast   ?3. Screening for cervical cancer   ? ? ?Plan: ?           ?1.  Cervical Screening-  Pap smear done today ? ?2. Breast screening- Exam annually and mammogram scheduled ? ?3. Colonoscopy every 10 years (UTD  2022) ? ?4. Labs managed by PCP ? ?5. Menopausal sx's of oligomenorrhea (last 2 episodes heavy, but spaced out, and hot flashes ?Patient with bothersome menopausal vasomotor symptoms. Discussed lifestyle interventions such as wearing light clothing, remaining in cool environments, having fan/air conditioner in the room, avoiding hot beverages etc.  Exercise also shown to be significantly helpful in alleviating hot flashes.  Discussed using hormone therapy and concerns about increased risk of heart disease, cerebrovascular disease, thromboembolic disease,  and breast cancer.  Also discussed other medical options such as Clonidine, SSRI, or Neurontin.   Also discussed alternative therapies such as herbal remedies but cautioned that most of the products contained phytoestrogens (plant estrogens) in unregulated amounts which can have the same effects on the body as the pharmaceutical estrogen preparations.  Patient opted for no therapy for now. Considering and info gv on [employer]. ? ?  F/U ? Return for and Annual when due. ? ?Barnett Applebaum, MD, [place] ?[facility], [place] ?05/24/2021  9:14 AM ? ? ?

## 2021-05-26 LAB — CYTOLOGY - PAP
Comment: NEGATIVE
Diagnosis: NEGATIVE
High risk HPV: NEGATIVE

## 2021-06-14 ENCOUNTER — Inpatient Hospital Stay: Payer: 59 | Admitting: Oncology

## 2021-06-14 ENCOUNTER — Inpatient Hospital Stay: Payer: 59 | Attending: Oncology

## 2021-06-14 ENCOUNTER — Encounter: Payer: Self-pay | Admitting: Oncology

## 2021-06-14 DIAGNOSIS — Z803 Family history of malignant neoplasm of breast: Secondary | ICD-10-CM | POA: Diagnosis not present

## 2021-06-14 LAB — FERRITIN: Ferritin: 94 ng/mL (ref 11–307)

## 2021-06-14 LAB — CBC WITH DIFFERENTIAL/PLATELET
Abs Immature Granulocytes: 0.02 10*3/uL (ref 0.00–0.07)
Basophils Absolute: 0 10*3/uL (ref 0.0–0.1)
Basophils Relative: 0 %
Eosinophils Absolute: 0.1 10*3/uL (ref 0.0–0.5)
Eosinophils Relative: 1 %
HCT: 43.9 % (ref 36.0–46.0)
Hemoglobin: 15.3 g/dL — ABNORMAL HIGH (ref 12.0–15.0)
Immature Granulocytes: 0 %
Lymphocytes Relative: 28 %
Lymphs Abs: 2.1 10*3/uL (ref 0.7–4.0)
MCH: 33.5 pg (ref 26.0–34.0)
MCHC: 34.9 g/dL (ref 30.0–36.0)
MCV: 96.1 fL (ref 80.0–100.0)
Monocytes Absolute: 0.6 10*3/uL (ref 0.1–1.0)
Monocytes Relative: 8 %
Neutro Abs: 4.9 10*3/uL (ref 1.7–7.7)
Neutrophils Relative %: 63 %
Platelets: 217 10*3/uL (ref 150–400)
RBC: 4.57 MIL/uL (ref 3.87–5.11)
RDW: 12 % (ref 11.5–15.5)
WBC: 7.8 10*3/uL (ref 4.0–10.5)
nRBC: 0 % (ref 0.0–0.2)

## 2021-06-14 NOTE — Progress Notes (Signed)
? ? ? ?Hematology/Oncology Consult note ?[facility]  ?[phone] [phone] ? ?Patient Care Team: ?Sofie Hartigan, MD as PCP - General (Family Medicine) ?Sindy Guadeloupe, MD as Consulting Physician (Hematology and Oncology)  ? ?Name of the patient: Kristin Hutchinson  ?[id number]  ?October 01, 1970  ? ?Date of visit: 06/14/21 ? ?Diagnosis- history of hereditary hemochromatosis ? ?Chief complaint/ Reason for visit- routine f/u of hereditary hemochromatosis ? 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  This was detected after her mother was diagnosed with hereditary hemochromatosis.  Hepatitis B and C testing negative.  She has required periodic phlebotomies in the past and the goal ferritin is to keep it less than 100.  No known baseline cirrhosis. ?  ? ?Interval history-patient is doing well overall and denies any specific complaints at this time. ? ?ECOG PS- 0 ?Pain scale- 0 ? ? ?Review of systems- Review of Systems  ?Constitutional:  Negative for chills, fever, malaise/fatigue and weight loss.  ?HENT:  Negative for congestion, ear discharge and nosebleeds.   ?Eyes:  Negative for blurred vision.  ?Respiratory:  Negative for cough, hemoptysis, sputum production, shortness of breath and wheezing.   ?Cardiovascular:  Negative for chest pain, palpitations, orthopnea and claudication.  ?Gastrointestinal:  Negative for abdominal pain, blood in stool, constipation, diarrhea, heartburn, melena, nausea and vomiting.  ?Genitourinary:  Negative for dysuria, flank pain, frequency, hematuria and urgency.  ?Musculoskeletal:  Negative for back pain, joint pain and myalgias.  ?Skin:  Negative for rash.  ?Neurological:  Negative for dizziness, tingling, focal weakness, seizures, weakness and headaches.  ?Endo/Heme/Allergies:  Does not bruise/bleed easily.  ?Psychiatric/Behavioral:  Negative for depression and suicidal  ideas. The patient does not have insomnia.    ? ?No Known Allergies ? ? ?Past Medical History:  ?Diagnosis Date  ? Blood disorder   ? COVID-19 12/2018  ? Hereditary hemochromatosis ([place])   ? Wears contact lenses   ? ? ? ?Past Surgical History:  ?Procedure Laterality Date  ? BREAST BIOPSY Right 12/03/2009  ? neg  ? COLONOSCOPY WITH PROPOFOL N/A 05/06/2020  ? Procedure: COLONOSCOPY WITH BIOPSY;  Surgeon: Lucilla Lame, MD;  Location: [facility];  Service: Endoscopy;  Laterality: N/A;  priority 4  ? POLYPECTOMY N/A 05/06/2020  ? Procedure: POLYPECTOMY;  Surgeon: Lucilla Lame, MD;  Location: [facility];  Service: Endoscopy;  Laterality: N/A;  ? ? ?Social History  ? ?Socioeconomic History  ? Marital status: Married  ?  Spouse name: Not on file  ? Number of children: Not on file  ? Years of education: Not on file  ? Highest education level: Not on file  ?Occupational History  ? Not on file  ?Tobacco Use  ? Smoking status: Never  ? Smokeless tobacco: Never  ?Vaping Use  ? Vaping Use: Never used  ?Substance and Sexual Activity  ? Alcohol use: Yes  ?  Alcohol/week: 5.0 standard drinks  ?  Types: 5 Cans of beer per week  ?  Comment: weekends  ? Drug use: No  ? Sexual activity: Yes  ?  Birth control/protection: I.U.D.  ?Other Topics Concern  ? Not on file  ?Social History Narrative  ? Not on file  ? ?Social Determinants of Health  ? ?Financial Resource Strain: Not on file  ?Food Insecurity: Not on file  ?Transportation Needs: Not on file  ?Physical Activity: Not on file  ?Stress: Not on file  ?Social Connections: Not on  file  ?Intimate Partner Violence: Not on file  ? ? ?Family History  ?Problem Relation Age of Onset  ? Breast cancer Mother 36  ? Hypertension Mother   ? Diabetes Father   ? Hypertension Father   ? ? ? ?Current Outpatient Medications:  ?  Cholecalciferol (VITAMIN D3 PO), Take by mouth daily., Disp: , Rfl:  ?  cyanocobalamin (,VITAMIN B-12,) 1000 MCG/ML injection, Inject 1 mL (1,000 mcg total) into  the muscle every 30 (thirty) days., Disp: 12 mL, Rfl: 0 ?  folic acid (FOLVITE) 1 MG tablet, Take 1 mg by mouth daily., Disp: , Rfl:  ?  Menaquinone-7 (VITAMIN K2 PO), Take by mouth., Disp: , Rfl:  ?  Multiple Vitamin (MULTI-VITAMIN) tablet, Take 1 tablet by mouth daily., Disp: , Rfl:  ?  Syringe/Needle, Disp, (SYRINGE 3CC/25GX1") 25G X 1" 3 ML MISC, 3 mLs by Does not apply route every 30 (thirty) days., Disp: 12 each, Rfl: 0 ?  valACYclovir (VALTREX) 1000 MG tablet, Take 4,000 mg by mouth once., Disp: , Rfl:  ?No current facility-administered medications for this visit. ? ?Facility-Administered Medications Ordered in Other Visits:  ?  cyanocobalamin ((VITAMIN B-12)) injection 1,000 mcg, 1,000 mcg, Intramuscular, Once, Sindy Guadeloupe, MD ? ?Physical exam:  ?Vitals:  ? 06/14/21 1314  ?BP: 140/88  ?Pulse: 89  ?Resp: 16  ?Temp: (!) 96.4 ?F (35.8 ?C)  ?TempSrc: Tympanic  ?SpO2: 100%  ?Weight: 145 lb (65.8 kg)  ? ?Physical Exam ?Cardiovascular:  ?   Rate and Rhythm: Normal rate and regular rhythm.  ?   Heart sounds: Normal heart sounds.  ?Pulmonary:  ?   Effort: Pulmonary effort is normal.  ?   Breath sounds: Normal breath sounds.  ?Abdominal:  ?   General: Bowel sounds are normal.  ?   Palpations: Abdomen is soft.  ?Skin: ?   General: Skin is warm and dry.  ?Neurological:  ?   Mental Status: She is alert and oriented to person, place, and time.  ?  ? ? ?  Latest Ref Rng & Units 03/16/2021  ?  1:37 PM  ?CMP  ?Glucose 70 - 99 mg/dL 94    ?BUN 6 - 20 mg/dL 10    ?Creatinine 0.44 - 1.00 mg/dL 0.66    ?Sodium 135 - 145 mmol/L 133    ?Potassium 3.5 - 5.1 mmol/L 4.1    ?Chloride 98 - 111 mmol/L 103    ?CO2 22 - 32 mmol/L 23    ?Calcium 8.9 - 10.3 mg/dL 8.7    ?Total Protein 6.5 - 8.1 g/dL 6.9    ?Total Bilirubin 0.3 - 1.2 mg/dL 1.2    ?Alkaline Phos 38 - 126 U/L 64    ?AST 15 - 41 U/L 23    ?ALT 0 - 44 U/L 19    ? ? ?  Latest Ref Rng & Units 06/14/2021  ? 12:44 PM  ?CBC  ?WBC 4.0 - 10.5 K/uL 7.8    ?Hemoglobin 12.0 - 15.0 g/dL  15.3    ?Hematocrit 36.0 - 46.0 % 43.9    ?Platelets 150 - 400 K/uL 217    ? ? ?No images are attached to the encounter. ? ?No results found. ? ? ?Assessment and plan- Patient is a 51 y.o. female with history of hereditary hemochromatosis here for routine follow-up ? ?Clinically patient is doing well.  Ferritin levels are 94.  I do not plan to proceed with phlebotomy unless ferritin levels are more than 100.  Hemoglobin  stable between 14-15.  We will repeat CBC CMP and ferritin in 3 in 6 months and I will see her back in 6 months.  Patient does not have any baseline cirrhosis and therefore does not require any [place] surveillance.  She did have an ultrasound last year as well which showed normal-appearing liver.  Patient noted to have mildly low vitamin B12 levels  274 in January 2023.  I will repeat that in 3 months as well ?  ?Visit Diagnosis ?1. Hereditary hemochromatosis ([place])   ? ? ? ?Dr. Randa Evens, MD, MPH ?[facility] ?[phone] ?06/14/2021 ?1:29 PM ? ? ? ? ? ? ?    ? ? ? ? ? ?

## 2021-06-14 NOTE — Progress Notes (Signed)
Pt in for follow up, denies any concerns today. 

## 2021-06-15 ENCOUNTER — Encounter: Payer: Self-pay | Admitting: *Deleted

## 2021-06-17 ENCOUNTER — Encounter: Payer: Self-pay | Admitting: Oncology

## 2021-06-17 IMAGING — US US ABDOMEN LIMITED RUQ/ASCITES
1 series · 14 of 25 positions shown · non-contrast
Comparison: 11/06/2019

CLINICAL DATA: Hereditary hemochromatosis

EXAM:
ULTRASOUND ABDOMEN LIMITED RIGHT UPPER QUADRANT

[Series 1: us abdomen limited ruq/ascites · 0.23mm/px · 14 of 185 slices shown]
[im 1/185]
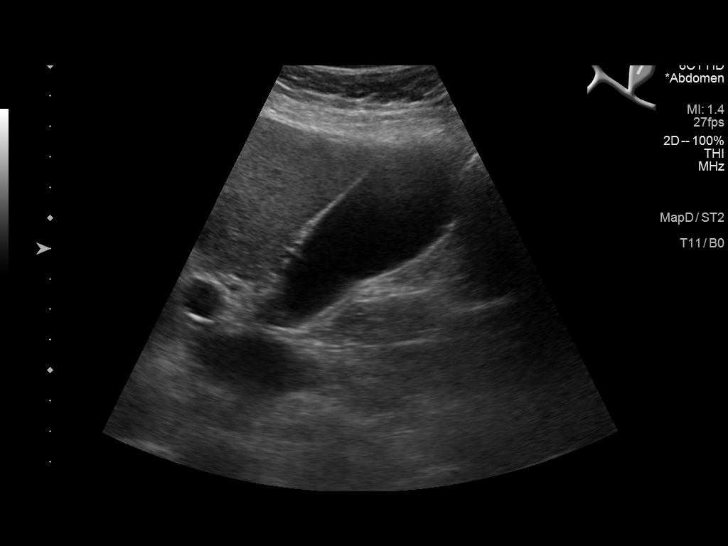
[im 16/185]
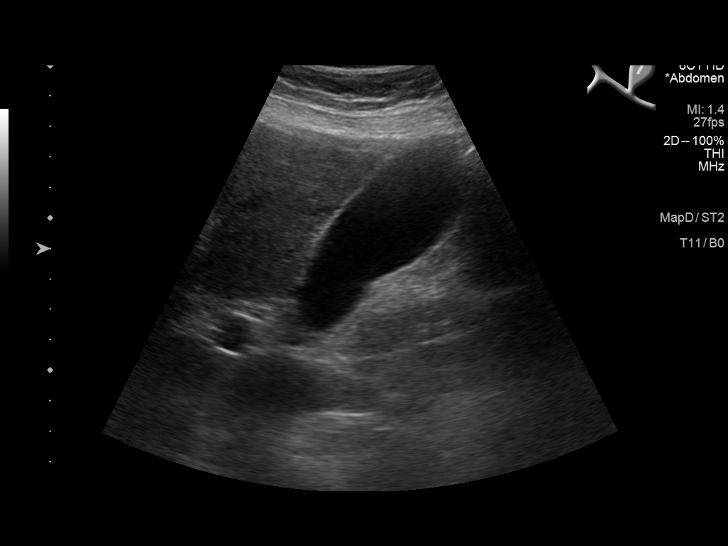
[im 31/185]
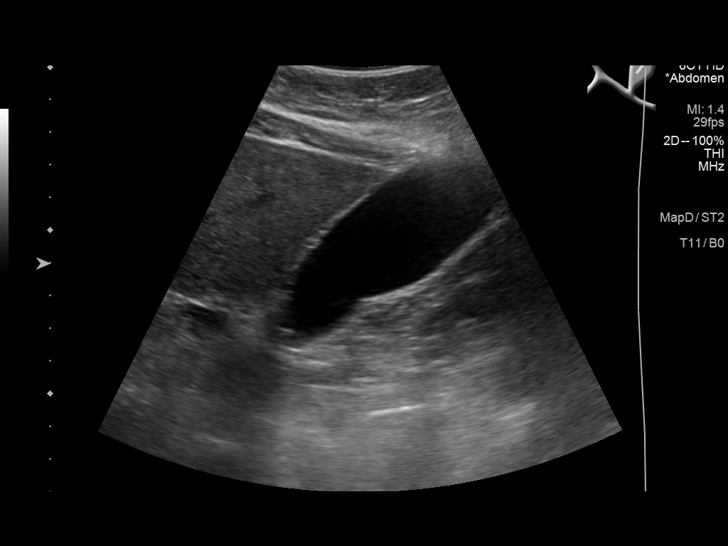
[im 47/185]
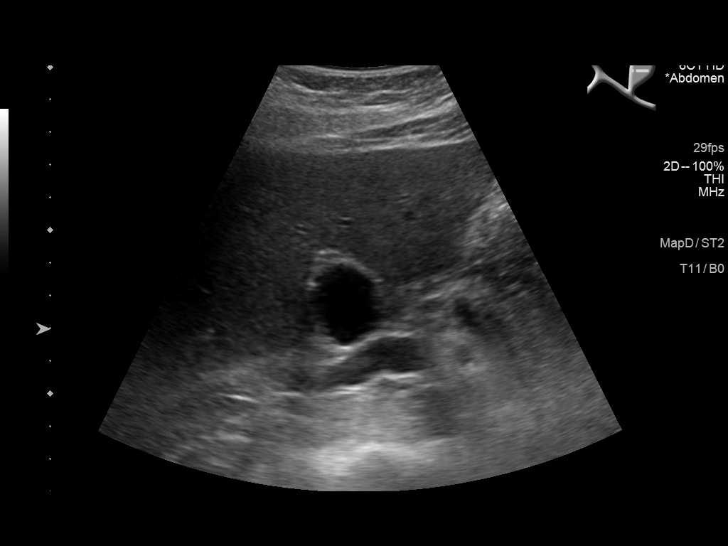
[im 62/185]
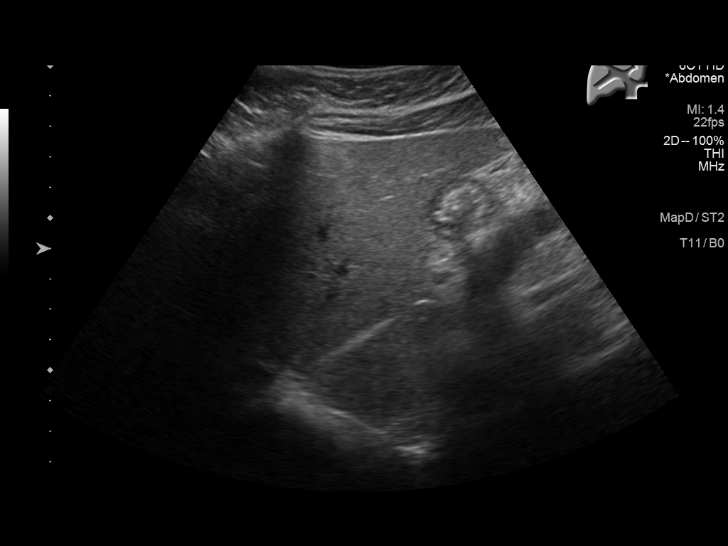
[im 70/185]
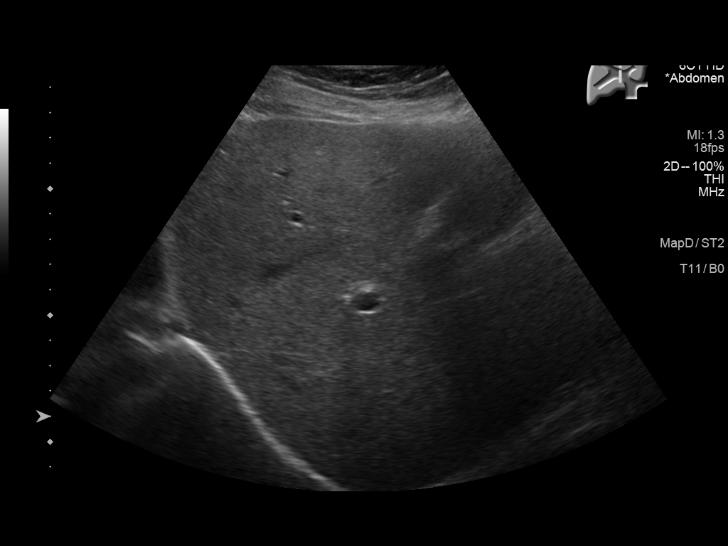
[im 85/185]
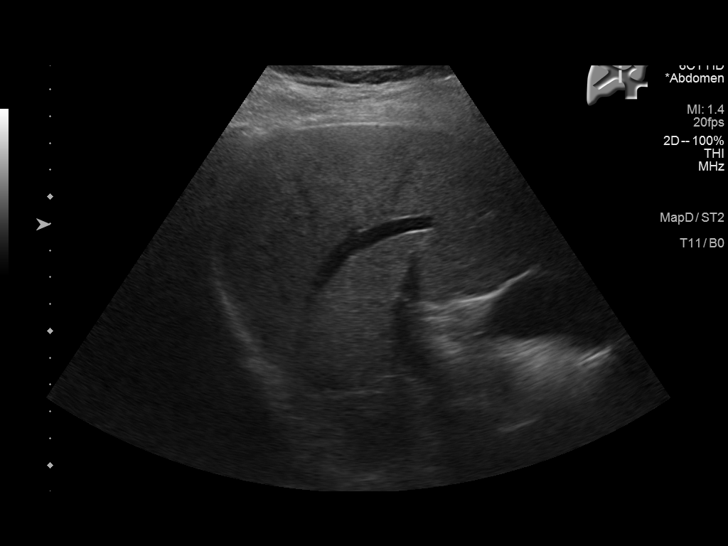
[im 100/185]
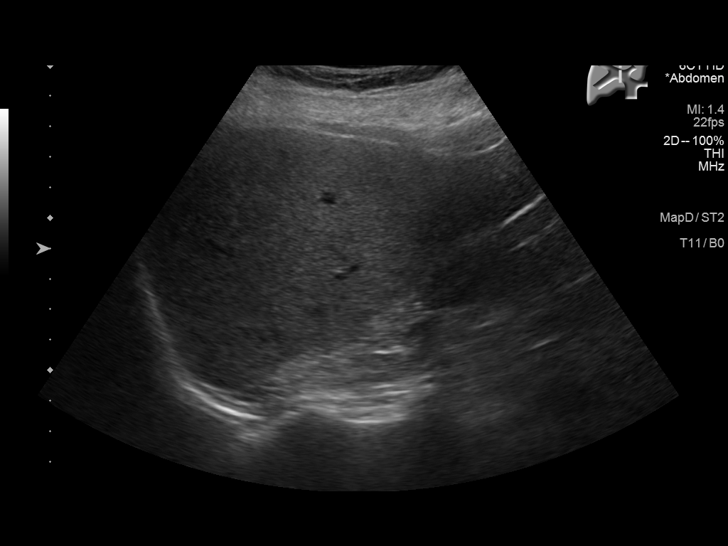
[im 116/185]
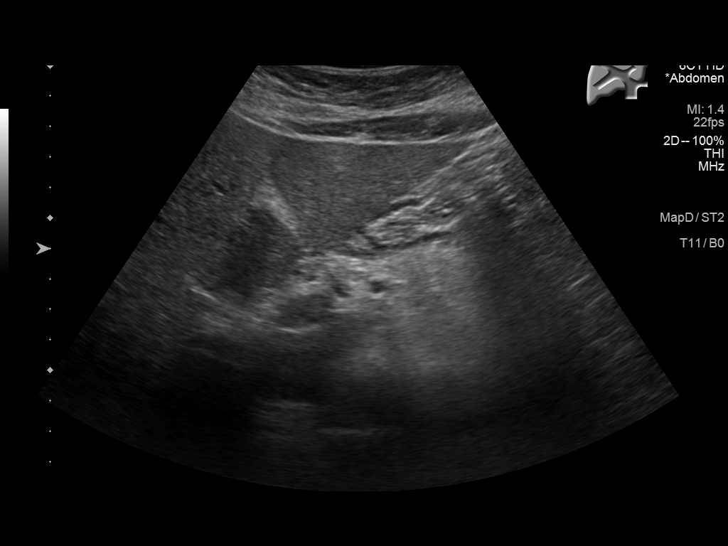
[im 123/185]
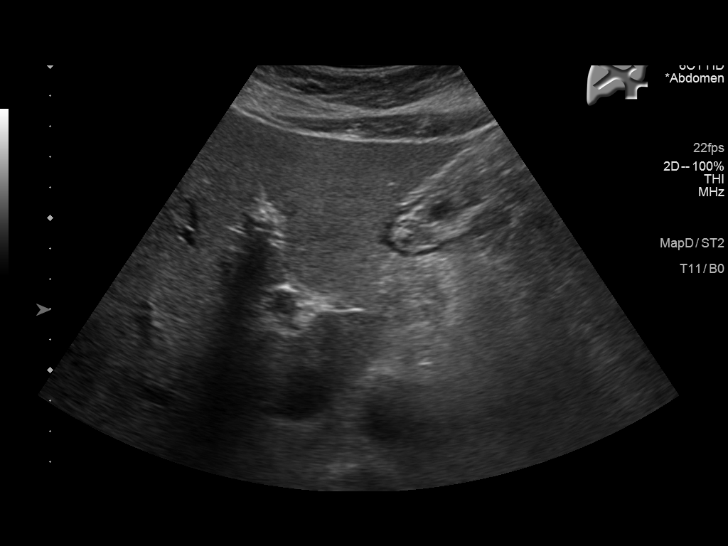
[im 139/185]
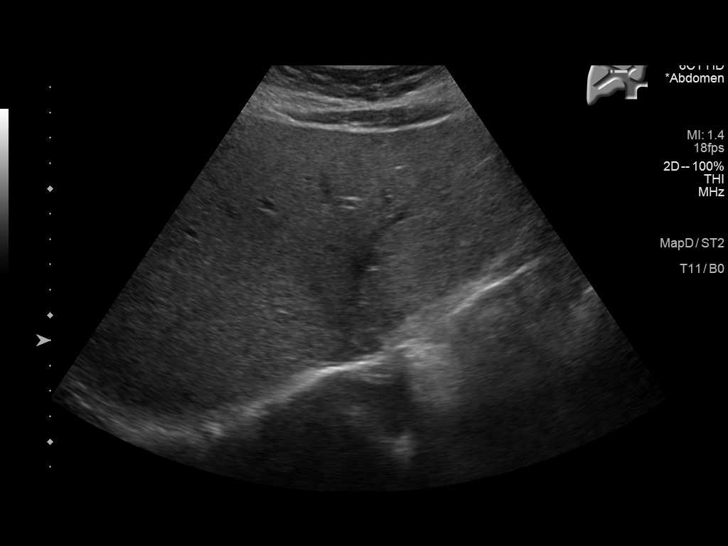
[im 154/185]
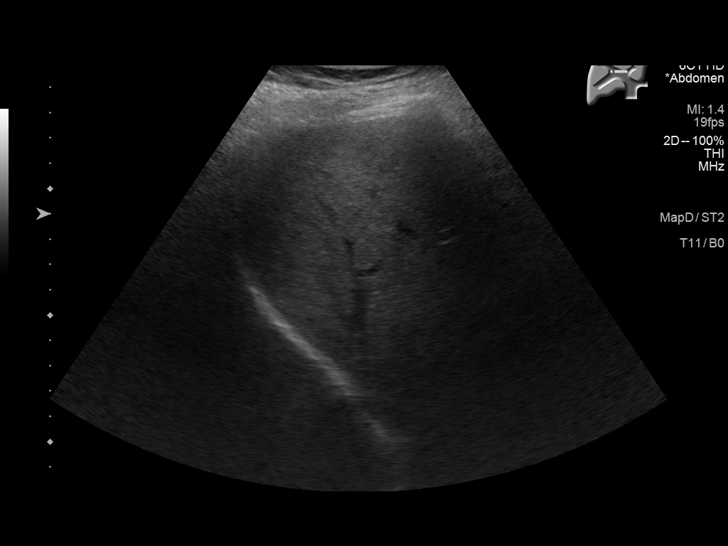
[im 169/185]
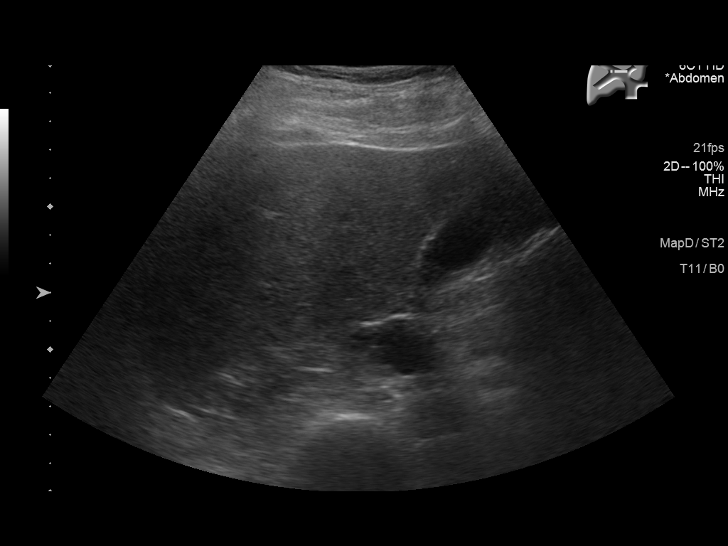
[im 185/185]
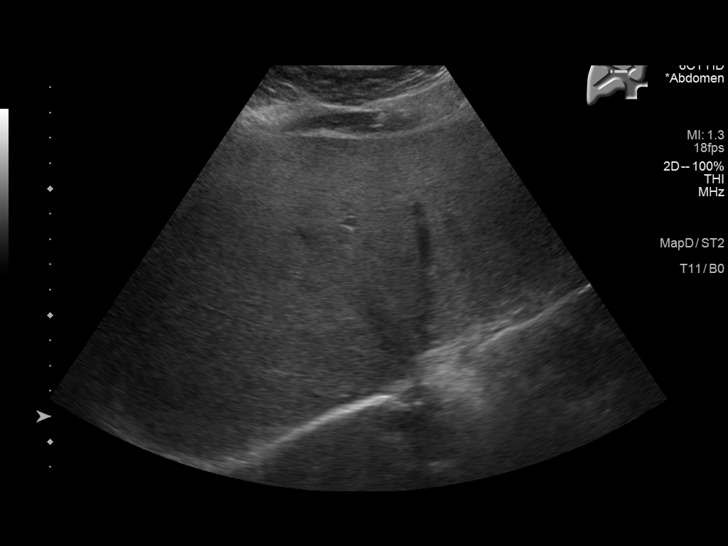

[14 of 25 positions shown; findings below may reference images not displayed]

FINDINGS: Gallbladder:

No gallstones or wall thickening visualized. No sonographic Murphy
sign noted by sonographer.

Common bile duct:

Diameter: 1.4 mm

Liver:

No focal lesion identified. Within normal limits in parenchymal
echogenicity. Portal vein is patent on color Doppler imaging with
normal direction of blood flow towards the liver.

Other: None.
IMPRESSION: No hepatic lesions identified.

## 2021-07-13 ENCOUNTER — Ambulatory Visit
Admission: RE | Admit: 2021-07-13 | Discharge: 2021-07-13 | Disposition: A | Discharge status: Home / Self Care | Payer: 59 | Source: Ambulatory Visit | Attending: Obstetrics & Gynecology | Admitting: Obstetrics & Gynecology

## 2021-07-13 DIAGNOSIS — Z1231 Encounter for screening mammogram for malignant neoplasm of breast: Secondary | ICD-10-CM | POA: Diagnosis present

## 2021-08-03 ENCOUNTER — Encounter: Payer: Self-pay | Admitting: Oncology

## 2021-08-03 NOTE — Telephone Encounter (Signed)
Signing encounter, see note on 04/06/20 

## 2021-09-13 ENCOUNTER — Other Ambulatory Visit: Payer: 59

## 2021-09-14 ENCOUNTER — Inpatient Hospital Stay: Payer: 59 | Attending: Oncology

## 2021-09-14 LAB — CBC
HCT: 45.4 % (ref 36.0–46.0)
Hemoglobin: 16.1 g/dL — ABNORMAL HIGH (ref 12.0–15.0)
MCH: 34.5 pg — ABNORMAL HIGH (ref 26.0–34.0)
MCHC: 35.5 g/dL (ref 30.0–36.0)
MCV: 97.2 fL (ref 80.0–100.0)
Platelets: 204 10*3/uL (ref 150–400)
RBC: 4.67 MIL/uL (ref 3.87–5.11)
RDW: 12.1 % (ref 11.5–15.5)
WBC: 6.6 10*3/uL (ref 4.0–10.5)
nRBC: 0 % (ref 0.0–0.2)

## 2021-09-14 LAB — COMPREHENSIVE METABOLIC PANEL
ALT: 16 U/L (ref 0–44)
AST: 20 U/L (ref 15–41)
Albumin: 4.4 g/dL (ref 3.5–5.0)
Alkaline Phosphatase: 69 U/L (ref 38–126)
Anion gap: 7 (ref 5–15)
BUN: 16 mg/dL (ref 6–20)
CO2: 24 mmol/L (ref 22–32)
Calcium: 8.8 mg/dL — ABNORMAL LOW (ref 8.9–10.3)
Chloride: 106 mmol/L (ref 98–111)
Creatinine, Ser: 0.74 mg/dL (ref 0.44–1.00)
GFR, Estimated: >60 mL/min (ref >60)
Glucose, Bld: 99 mg/dL (ref 70–99)
Potassium: 4 mmol/L (ref 3.5–5.1)
Sodium: 137 mmol/L (ref 135–145)
Total Bilirubin: 0.9 mg/dL (ref 0.3–1.2)
Total Protein: 7.4 g/dL (ref 6.5–8.1)

## 2021-09-14 LAB — FERRITIN: Ferritin: 102 ng/mL (ref 11–307)

## 2021-12-15 ENCOUNTER — Other Ambulatory Visit: Payer: 59

## 2021-12-15 ENCOUNTER — Ambulatory Visit: Payer: 59 | Admitting: Oncology

## 2021-12-25 ENCOUNTER — Other Ambulatory Visit: Payer: Self-pay | Admitting: Oncology

## 2022-01-10 ENCOUNTER — Encounter: Payer: Self-pay | Admitting: Oncology

## 2022-01-10 ENCOUNTER — Inpatient Hospital Stay: Payer: 59 | Admitting: Oncology

## 2022-01-10 ENCOUNTER — Inpatient Hospital Stay: Payer: 59 | Attending: Oncology

## 2022-01-10 DIAGNOSIS — Z803 Family history of malignant neoplasm of breast: Secondary | ICD-10-CM | POA: Diagnosis not present

## 2022-01-10 LAB — COMPREHENSIVE METABOLIC PANEL
ALT: 20 U/L (ref 0–44)
AST: 23 U/L (ref 15–41)
Albumin: 4.4 g/dL (ref 3.5–5.0)
Alkaline Phosphatase: 61 U/L (ref 38–126)
Anion gap: 8 (ref 5–15)
BUN: 14 mg/dL (ref 6–20)
CO2: 23 mmol/L (ref 22–32)
Calcium: 9.3 mg/dL (ref 8.9–10.3)
Chloride: 106 mmol/L (ref 98–111)
Creatinine, Ser: 0.68 mg/dL (ref 0.44–1.00)
GFR, Estimated: >60 mL/min (ref >60)
Glucose, Bld: 99 mg/dL (ref 70–99)
Potassium: 4.4 mmol/L (ref 3.5–5.1)
Sodium: 137 mmol/L (ref 135–145)
Total Bilirubin: 1 mg/dL (ref 0.3–1.2)
Total Protein: 7.5 g/dL (ref 6.5–8.1)

## 2022-01-10 LAB — CBC
HCT: 45.4 % (ref 36.0–46.0)
Hemoglobin: 15.7 g/dL — ABNORMAL HIGH (ref 12.0–15.0)
MCH: 33.6 pg (ref 26.0–34.0)
MCHC: 34.6 g/dL (ref 30.0–36.0)
MCV: 97.2 fL (ref 80.0–100.0)
Platelets: 229 10*3/uL (ref 150–400)
RBC: 4.67 MIL/uL (ref 3.87–5.11)
RDW: 11.8 % (ref 11.5–15.5)
WBC: 6.3 10*3/uL (ref 4.0–10.5)
nRBC: 0 % (ref 0.0–0.2)

## 2022-01-10 LAB — FERRITIN: Ferritin: 146 ng/mL (ref 11–307)

## 2022-01-10 LAB — VITAMIN B12: Vitamin B-12: 368 pg/mL (ref 180–914)

## 2022-01-10 NOTE — Progress Notes (Signed)
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  This was detected after her mother was diagnosed with hereditary hemochromatosis.  Hepatitis B and C testing negative.  She has required periodic phlebotomies in the past and the goal ferritin is to keep it less than 100.  No known baseline cirrhosis.  She was diagnosed sometime in 2020 when her ferritin levels were close to 900    Interval history-doing well presently.  Denies any specific complaints at this time  ECOG PS- 0 Pain scale- 0   Review of systems- Review of Systems  Constitutional:  Negative for chills, fever, malaise/fatigue and weight loss.  HENT:  Negative for congestion, ear discharge and nosebleeds.   Eyes:  Negative for blurred vision.  Respiratory:  Negative for cough, hemoptysis, sputum production, shortness of breath and wheezing.   Cardiovascular:  Negative for chest pain, palpitations, orthopnea and claudication.  Gastrointestinal:  Negative for abdominal pain, blood in stool, constipation, diarrhea, heartburn, melena, nausea and vomiting.  Genitourinary:  Negative for dysuria, flank pain, frequency, hematuria and urgency.  Musculoskeletal:  Negative for back pain, joint pain and myalgias.  Skin:  Negative for rash.  Neurological:  Negative for dizziness, tingling, focal weakness, seizures, weakness and headaches.  Endo/Heme/Allergies:  Does  not bruise/bleed easily.  Psychiatric/Behavioral:  Negative for depression and suicidal ideas. The patient does not have insomnia.       No Known Allergies   Past Medical History:  Diagnosis Date   Blood disorder    COVID-19 12/2018   Hereditary hemochromatosis ([place])    Wears contact lenses      Past Surgical History:  Procedure Laterality Date   BREAST BIOPSY Right 12/03/2009   neg   COLONOSCOPY WITH PROPOFOL N/A 05/06/2020   Procedure: COLONOSCOPY WITH BIOPSY;  Surgeon: Lucilla Lame, MD;  Location: [place];  Service: Endoscopy;  Laterality: N/A;  priority 4   POLYPECTOMY N/A 05/06/2020   Procedure: POLYPECTOMY;  Surgeon: Lucilla Lame, MD;  Location: [place];  Service: Endoscopy;  Laterality: N/A;    Social History   Socioeconomic History   Marital status: Married    Spouse name: Not on file   Number of children: Not on file   Years of education: Not on file   Highest education level: Not on file  Occupational History   Not on file  Tobacco Use   Smoking status: Never   Smokeless tobacco: Never  Vaping Use   Vaping Use: Never used  Substance and Sexual Activity   Alcohol use: Yes    Alcohol/week: 5.0 standard drinks of alcohol    Types: 5 Cans of beer per week    Comment: weekends   Drug use: No   Sexual activity: Yes    Birth control/protection: I.U.D.  Other Topics Concern   Not on file  Social History Narrative   Not on file   Social Determinants of Health   Financial Resource Strain: Not on file  Food Insecurity: Not on file  Transportation Needs:  Not on file  Physical Activity: Not on file  Stress: Not on file  Social Connections: Not on file  Intimate Partner Violence: Not on file    Family History  Problem Relation Age of Onset   Breast cancer Mother 28   Hypertension Mother    Diabetes Father    Hypertension Father      Current Outpatient Medications:    Cholecalciferol (VITAMIN D3 PO), Take by mouth daily., Disp:  , Rfl:    cyanocobalamin (VITAMIN B12) 1000 MCG/ML injection, INJECT 1ML (1 000MCG) INTO THE MUSCLE EVERY 30 DAYS, Disp: 1 mL, Rfl: 11   folic acid (FOLVITE) 1 MG tablet, Take 1 mg by mouth daily., Disp: , Rfl:    Menaquinone-7 (VITAMIN K2 PO), Take by mouth., Disp: , Rfl:    Multiple Vitamin (MULTI-VITAMIN) tablet, Take 1 tablet by mouth daily., Disp: , Rfl:    valACYclovir (VALTREX) 1000 MG tablet, Take 4,000 mg by mouth once., Disp: , Rfl:    B-D 3CC LUER-LOK SYR 25GX1" 25G X 1" 3 ML MISC, USE AS DIRECTED, Disp: 12 each, Rfl: 0 No current facility-administered medications for this visit.  Facility-Administered Medications Ordered in Other Visits:    cyanocobalamin ((VITAMIN B-12)) injection 1,000 mcg, 1,000 mcg, Intramuscular, Once, Sindy Guadeloupe, MD  Physical exam:  Vitals:   01/10/22 1032  BP: (!) 166/86  Pulse: 85  Resp: 16  Temp: (!) 97.3 F (36.3 C)  SpO2: 100%  Weight: 139 lb 3.2 oz (63.1 kg)   Physical Exam Cardiovascular:     Rate and Rhythm: Normal rate and regular rhythm.     Heart sounds: Normal heart sounds.  Pulmonary:     Effort: Pulmonary effort is normal.     Breath sounds: Normal breath sounds.  Abdominal:     General: Bowel sounds are normal.     Palpations: Abdomen is soft.  Skin:    General: Skin is warm and dry.  Neurological:     Mental Status: She is alert and oriented to person, place, and time.         Latest Ref Rng & Units 01/10/2022   10:12 AM  CMP  Glucose 70 - 99 mg/dL 99   BUN 6 - 20 mg/dL 14   Creatinine 0.44 - 1.00 mg/dL 0.68   Sodium 135 - 145 mmol/L 137   Potassium 3.5 - 5.1 mmol/L 4.4   Chloride 98 - 111 mmol/L 106   CO2 22 - 32 mmol/L 23   Calcium 8.9 - 10.3 mg/dL 9.3   Total Protein 6.5 - 8.1 g/dL 7.5   Total Bilirubin 0.3 - 1.2 mg/dL 1.0   Alkaline Phos 38 - 126 U/L 61   AST 15 - 41 U/L 23   ALT 0 - 44 U/L 20       Latest Ref Rng & Units 01/10/2022   10:12 AM  CBC  WBC 4.0 - 10.5 K/uL 6.3   Hemoglobin 12.0 - 15.0  g/dL 15.7   Hematocrit 36.0 - 46.0 % 45.4   Platelets 150 - 400 K/uL 229      Assessment and plan- Patient is a 51 y.o. female with history of hereditary hemochromatosis.  She is here for routine follow-up   Patient has not required phlebotomy for over a year now.  Her CBC and CMP were within normal limits.  She does not take any iron supplements.  Does not drink any alcohol regularly.  Ferritin levels were pending at the time of my  visit today but later on came back at 146.  Goal ferritin is less than 100.  She will therefore proceed with phlebotomy next week and we will repeat her CBC and ferritin again 3 weeks from now for possible phlebotomy.  Otherwise I will repeat CBC and CMP and ferritin levels in 3 in 6 months and I will see her back in 6 months   Visit Diagnosis 1. Hereditary hemochromatosis ([place])      Dr. Randa Evens, MD, MPH [facility] [phone] 01/10/2022 1:27 PM

## 2022-01-11 ENCOUNTER — Inpatient Hospital Stay: Payer: 59

## 2022-01-17 ENCOUNTER — Inpatient Hospital Stay: Payer: 59

## 2022-01-17 ENCOUNTER — Other Ambulatory Visit: Payer: 59

## 2022-01-31 ENCOUNTER — Inpatient Hospital Stay: Payer: 59

## 2022-01-31 ENCOUNTER — Other Ambulatory Visit: Payer: Self-pay | Admitting: Oncology

## 2022-01-31 VITALS — BP 159/99 | HR 90 | Resp 18

## 2022-01-31 DIAGNOSIS — R7989 Other specified abnormal findings of blood chemistry: Secondary | ICD-10-CM

## 2022-01-31 LAB — COMPREHENSIVE METABOLIC PANEL
ALT: 19 U/L (ref 0–44)
AST: 25 U/L (ref 15–41)
Albumin: 4.2 g/dL (ref 3.5–5.0)
Alkaline Phosphatase: 72 U/L (ref 38–126)
Anion gap: 9 (ref 5–15)
BUN: 12 mg/dL (ref 6–20)
CO2: 27 mmol/L (ref 22–32)
Calcium: 9.2 mg/dL (ref 8.9–10.3)
Chloride: 103 mmol/L (ref 98–111)
Creatinine, Ser: 0.7 mg/dL (ref 0.44–1.00)
GFR, Estimated: >60 mL/min (ref >60)
Glucose, Bld: 99 mg/dL (ref 70–99)
Potassium: 4 mmol/L (ref 3.5–5.1)
Sodium: 139 mmol/L (ref 135–145)
Total Bilirubin: 0.9 mg/dL (ref 0.3–1.2)
Total Protein: 7.6 g/dL (ref 6.5–8.1)

## 2022-01-31 LAB — CBC WITH DIFFERENTIAL/PLATELET
Abs Immature Granulocytes: 0.01 10*3/uL (ref 0.00–0.07)
Basophils Absolute: 0.1 10*3/uL (ref 0.0–0.1)
Basophils Relative: 1 %
Eosinophils Absolute: 0.1 10*3/uL (ref 0.0–0.5)
Eosinophils Relative: 2 %
HCT: 45.2 % (ref 36.0–46.0)
Hemoglobin: 15.8 g/dL — ABNORMAL HIGH (ref 12.0–15.0)
Immature Granulocytes: 0 %
Lymphocytes Relative: 27 %
Lymphs Abs: 1.9 10*3/uL (ref 0.7–4.0)
MCH: 33.7 pg (ref 26.0–34.0)
MCHC: 35 g/dL (ref 30.0–36.0)
MCV: 96.4 fL (ref 80.0–100.0)
Monocytes Absolute: 0.8 10*3/uL (ref 0.1–1.0)
Monocytes Relative: 11 %
Neutro Abs: 4.3 10*3/uL (ref 1.7–7.7)
Neutrophils Relative %: 59 %
Platelets: 251 10*3/uL (ref 150–400)
RBC: 4.69 MIL/uL (ref 3.87–5.11)
RDW: 11.9 % (ref 11.5–15.5)
WBC: 7.1 10*3/uL (ref 4.0–10.5)
nRBC: 0 % (ref 0.0–0.2)

## 2022-01-31 LAB — FERRITIN: Ferritin: 175 ng/mL (ref 11–307)

## 2022-01-31 NOTE — Progress Notes (Signed)
Patient receiving her 1st phlebotomy . She canceled previous appointment. Removed 300 ml of blood per Verbal order Dr Janese Banks. Pt tolerated procedure well. Accepted a beverage. VSS at time of discharge. Offers no complaints. Will send message to team to schedule another phlebotomy based off lab results today.

## 2022-01-31 NOTE — Patient Instructions (Signed)

## 2022-02-01 ENCOUNTER — Other Ambulatory Visit: Payer: Self-pay | Admitting: *Deleted

## 2022-02-07 ENCOUNTER — Inpatient Hospital Stay: Payer: 59 | Attending: Oncology

## 2022-02-07 VITALS — BP 150/94 | HR 78 | Resp 20

## 2022-02-07 DIAGNOSIS — R7989 Other specified abnormal findings of blood chemistry: Secondary | ICD-10-CM

## 2022-02-15 ENCOUNTER — Inpatient Hospital Stay: Payer: 59

## 2022-02-15 LAB — FERRITIN: Ferritin: 84 ng/mL (ref 11–307)

## 2022-04-12 ENCOUNTER — Inpatient Hospital Stay: Payer: 59 | Attending: Oncology

## 2022-04-12 LAB — CBC WITH DIFFERENTIAL/PLATELET
Abs Immature Granulocytes: 0.02 10*3/uL (ref 0.00–0.07)
Basophils Absolute: 0 10*3/uL (ref 0.0–0.1)
Basophils Relative: 1 %
Eosinophils Absolute: 0.1 10*3/uL (ref 0.0–0.5)
Eosinophils Relative: 1 %
HCT: 46.2 % — ABNORMAL HIGH (ref 36.0–46.0)
Hemoglobin: 15.8 g/dL — ABNORMAL HIGH (ref 12.0–15.0)
Immature Granulocytes: 0 %
Lymphocytes Relative: 28 %
Lymphs Abs: 1.7 10*3/uL (ref 0.7–4.0)
MCH: 33.1 pg (ref 26.0–34.0)
MCHC: 34.2 g/dL (ref 30.0–36.0)
MCV: 96.9 fL (ref 80.0–100.0)
Monocytes Absolute: 0.6 10*3/uL (ref 0.1–1.0)
Monocytes Relative: 10 %
Neutro Abs: 3.7 10*3/uL (ref 1.7–7.7)
Neutrophils Relative %: 60 %
Platelets: 222 10*3/uL (ref 150–400)
RBC: 4.77 MIL/uL (ref 3.87–5.11)
RDW: 11.6 % (ref 11.5–15.5)
WBC: 6.1 10*3/uL (ref 4.0–10.5)
nRBC: 0 % (ref 0.0–0.2)

## 2022-04-12 LAB — COMPREHENSIVE METABOLIC PANEL
ALT: 16 U/L (ref 0–44)
AST: 23 U/L (ref 15–41)
Albumin: 4.3 g/dL (ref 3.5–5.0)
Alkaline Phosphatase: 69 U/L (ref 38–126)
Anion gap: 8 (ref 5–15)
BUN: 14 mg/dL (ref 6–20)
CO2: 25 mmol/L (ref 22–32)
Calcium: 9.3 mg/dL (ref 8.9–10.3)
Chloride: 104 mmol/L (ref 98–111)
Creatinine, Ser: 0.71 mg/dL (ref 0.44–1.00)
GFR, Estimated: >60 mL/min (ref >60)
Glucose, Bld: 109 mg/dL — ABNORMAL HIGH (ref 70–99)
Potassium: 4.5 mmol/L (ref 3.5–5.1)
Sodium: 137 mmol/L (ref 135–145)
Total Bilirubin: 0.6 mg/dL (ref 0.3–1.2)
Total Protein: 7.6 g/dL (ref 6.5–8.1)

## 2022-04-12 LAB — FERRITIN: Ferritin: 62 ng/mL (ref 11–307)

## 2022-04-15 ENCOUNTER — Encounter: Payer: Self-pay | Admitting: *Deleted

## 2022-07-11 ENCOUNTER — Inpatient Hospital Stay: Payer: 59 | Attending: Oncology

## 2022-07-11 ENCOUNTER — Encounter: Payer: Self-pay | Admitting: Oncology

## 2022-07-11 ENCOUNTER — Inpatient Hospital Stay: Payer: 59 | Admitting: Oncology

## 2022-07-11 ENCOUNTER — Other Ambulatory Visit: Payer: Self-pay

## 2022-07-11 DIAGNOSIS — Z803 Family history of malignant neoplasm of breast: Secondary | ICD-10-CM | POA: Diagnosis not present

## 2022-07-11 DIAGNOSIS — R7989 Other specified abnormal findings of blood chemistry: Secondary | ICD-10-CM

## 2022-07-11 LAB — CMP (CANCER CENTER ONLY)
ALT: 20 U/L (ref 0–44)
AST: 29 U/L (ref 15–41)
Albumin: 4.1 g/dL (ref 3.5–5.0)
Alkaline Phosphatase: 69 U/L (ref 38–126)
Anion gap: 8 (ref 5–15)
BUN: 12 mg/dL (ref 6–20)
CO2: 26 mmol/L (ref 22–32)
Calcium: 9.2 mg/dL (ref 8.9–10.3)
Chloride: 103 mmol/L (ref 98–111)
Creatinine: 0.76 mg/dL (ref 0.44–1.00)
GFR, Estimated: >60 mL/min (ref >60)
Glucose, Bld: 103 mg/dL — ABNORMAL HIGH (ref 70–99)
Potassium: 3.9 mmol/L (ref 3.5–5.1)
Sodium: 137 mmol/L (ref 135–145)
Total Bilirubin: 1.3 mg/dL — ABNORMAL HIGH (ref 0.3–1.2)
Total Protein: 7.1 g/dL (ref 6.5–8.1)

## 2022-07-11 LAB — FERRITIN: Ferritin: 106 ng/mL (ref 11–307)

## 2022-07-11 LAB — CBC WITH DIFFERENTIAL (CANCER CENTER ONLY)
Abs Immature Granulocytes: 0.02 10*3/uL (ref 0.00–0.07)
Basophils Absolute: 0.1 10*3/uL (ref 0.0–0.1)
Basophils Relative: 1 %
Eosinophils Absolute: 0.1 10*3/uL (ref 0.0–0.5)
Eosinophils Relative: 1 %
HCT: 42.5 % (ref 36.0–46.0)
Hemoglobin: 14.9 g/dL (ref 12.0–15.0)
Immature Granulocytes: 0 %
Lymphocytes Relative: 30 %
Lymphs Abs: 2.2 10*3/uL (ref 0.7–4.0)
MCH: 33.9 pg (ref 26.0–34.0)
MCHC: 35.1 g/dL (ref 30.0–36.0)
MCV: 96.8 fL (ref 80.0–100.0)
Monocytes Absolute: 0.7 10*3/uL (ref 0.1–1.0)
Monocytes Relative: 9 %
Neutro Abs: 4.3 10*3/uL (ref 1.7–7.7)
Neutrophils Relative %: 59 %
Platelet Count: 219 10*3/uL (ref 150–400)
RBC: 4.39 MIL/uL (ref 3.87–5.11)
RDW: 12.5 % (ref 11.5–15.5)
WBC Count: 7.4 10*3/uL (ref 4.0–10.5)
nRBC: 0 % (ref 0.0–0.2)

## 2022-07-11 LAB — IRON AND TIBC
Iron: 270 ug/dL — ABNORMAL HIGH (ref 28–170)
Saturation Ratios: 90 % — ABNORMAL HIGH (ref 10.4–31.8)
TIBC: 301 ug/dL (ref 250–450)
UIBC: 31 ug/dL

## 2022-07-12 ENCOUNTER — Encounter: Payer: Self-pay | Admitting: Oncology

## 2022-07-12 ENCOUNTER — Telehealth: Payer: Self-pay | Admitting: Oncology

## 2022-07-12 ENCOUNTER — Other Ambulatory Visit: Payer: Self-pay | Admitting: *Deleted

## 2022-07-12 ENCOUNTER — Telehealth: Payer: Self-pay | Admitting: *Deleted

## 2022-07-12 NOTE — Telephone Encounter (Signed)
-----   Message from Creig Hines, MD sent at 07/12/2022 11:12 AM EDT ----- Ferritin 106. Needs phlebotomy weekly X 2. Check ferritin with second session

## 2022-07-12 NOTE — Telephone Encounter (Signed)
Dr. Smith Robert sent me a message for the pt to get phlebotomy 1 week and then 2nd week. On the second week we will get blood work first and then phlebotomy after that. I told her that the ferritin 107. Asked her to call be back or send my chart if she can do this. Gave her my umber to call

## 2022-07-12 NOTE — Progress Notes (Signed)
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  This was detected after her mother was diagnosed with hereditary hemochromatosis.  Hepatitis B and C testing negative.  She has required periodic phlebotomies in the past and the goal ferritin is to keep it less than 100.  No known baseline cirrhosis.  She was diagnosed sometime in 2020 when her ferritin levels were close to 900    Interval history-patient is doing well overall and denies any complaints at this time.  ECOG PS- 0 Pain scale- 0   Review of systems- Review of Systems  Constitutional:  Negative for chills, fever, malaise/fatigue and weight loss.  HENT:  Negative for congestion, ear discharge and nosebleeds.   Eyes:  Negative for blurred vision.  Respiratory:  Negative for cough, hemoptysis, sputum production, shortness of breath and wheezing.   Cardiovascular:  Negative for chest pain, palpitations, orthopnea and claudication.  Gastrointestinal:  Negative for abdominal pain, blood in stool, constipation, diarrhea, heartburn, melena, nausea and vomiting.  Genitourinary:  Negative for dysuria, flank pain, frequency, hematuria and urgency.  Musculoskeletal:  Negative for back pain, joint pain and myalgias.  Skin:  Negative for rash.  Neurological:  Negative for dizziness, tingling, focal weakness, seizures, weakness and headaches.  Endo/Heme/Allergies:  Does not bruise/bleed easily.   Psychiatric/Behavioral:  Negative for depression and suicidal ideas. The patient does not have insomnia.       No Known Allergies   Past Medical History:  Diagnosis Date   Blood disorder    COVID-19 12/2018   Hereditary hemochromatosis ([facility])    Wears contact lenses      Past Surgical History:  Procedure Laterality Date   BREAST BIOPSY Right 12/03/2009   neg   COLONOSCOPY WITH PROPOFOL N/A 05/06/2020   Procedure: COLONOSCOPY WITH BIOPSY;  Surgeon: Midge Minium, MD;  [facility];  Service: Endoscopy;  Laterality: N/A;  priority 4   POLYPECTOMY N/A 05/06/2020   Procedure: POLYPECTOMY;  Surgeon: Midge Minium, MD;  Location: [facility];  Service: Endoscopy;  Laterality: N/A;    Social History   Socioeconomic History   Marital status: Married    Spouse name: Not on file   Number of children: Not on file   Years of education: Not on file   Highest education level: Not on file  Occupational History   Not on file  Tobacco Use   Smoking status: Never   Smokeless tobacco: Never  Vaping Use   Vaping Use: Never used  Substance and Sexual Activity   Alcohol use: Yes    Alcohol/week: 5.0 standard drinks of alcohol    Types: 5 Cans of beer per week    Comment: weekends   Drug use: No   Sexual activity: Yes    Birth control/protection: I.U.D.  Other Topics Concern   Not on file  Social History Narrative   Not on file   Social Determinants of Health   Financial Resource Strain: Not on file  Food Insecurity: Not on file  Transportation Needs: Not  on file  Physical Activity: Not on file  Stress: Not on file  Social Connections: Not on file  Intimate Partner Violence: Not on file    Family History  Problem Relation Age of Onset   Breast cancer Mother 91   Hypertension Mother    Diabetes Father    Hypertension Father      Current Outpatient Medications:    B-D 3CC LUER-LOK SYR 25GX1" 25G X 1" 3 ML MISC, USE AS DIRECTED, Disp: 12 each, Rfl:  0   Cholecalciferol (VITAMIN D3 PO), Take by mouth daily., Disp: , Rfl:    cyanocobalamin (VITAMIN B12) 1000 MCG/ML injection, INJECT (1 ) INTO THE MUSCLE EVERY 30 DAYS, Disp: 1 mL, Rfl: 11   folic acid (FOLVITE) 1 MG tablet, Take 1 mg by mouth daily., Disp: , Rfl:    Menaquinone-7 (VITAMIN K2 PO), Take by mouth., Disp: , Rfl:    Multiple Vitamin (MULTI-VITAMIN) tablet, Take 1 tablet by mouth daily., Disp: , Rfl:    valACYclovir (VALTREX) 1000 MG tablet, Take 4,000 mg by mouth once., Disp: , Rfl:  No current facility-administered medications for this visit.  Facility-Administered Medications Ordered in Other Visits:    cyanocobalamin ((VITAMIN B-12)) injection 1,000 mcg, 1,000 mcg, Intramuscular, Once, Creig Hines, MD  Physical exam:  Vitals:   07/11/22 1457 07/11/22 1503 07/11/22 1521  BP: (!) 164/108 (!) 155/96 (!) 166/105  Pulse: (!) 106 (!) 107 (!) 105  Resp: 16    Temp: 98.4 F (36.9 C)    TempSrc: Tympanic    SpO2: 100%    Weight: 147 lb 8 oz (66.9 kg)    Height: 5\' 7"  (1.702 m)     Physical Exam Cardiovascular:     Rate and Rhythm: Normal rate and regular rhythm.     Heart sounds: Normal heart sounds.  Pulmonary:     Effort: Pulmonary effort is normal.     Breath sounds: Normal breath sounds.  Abdominal:     General: Bowel sounds are normal.     Palpations: Abdomen is soft.  Skin:    General: Skin is warm and dry.  Neurological:     Mental Status: She is alert and oriented to person, place, and time.         Latest Ref Rng & Units 07/11/2022    2:53 PM  CMP  Glucose 70 - 99 mg/dL 161   BUN 6 - 20 mg/dL 12   Creatinine 0.96 - 1.00 mg/dL 0.45   Sodium 409 - 811 mmol/L 137   Potassium 3.5 - 5.1 mmol/L 3.9   Chloride 98 - 111 mmol/L 103   CO2 22 - 32 mmol/L 26   Calcium 8.9 - 10.3 mg/dL 9.2   Total Protein 6.5 - 8.1 g/dL 7.1   Total Bilirubin 0.3 - 1.2 mg/dL 1.3   Alkaline Phos 38 - 126 U/L 69   AST 15 - 41 U/L 29   ALT 0 - 44 U/L 20        Latest Ref Rng & Units 07/11/2022    2:53 PM  CBC  WBC 4.0 - 10.5 K/uL 7.4   Hemoglobin 12.0 - 15.0 g/dL 91.4   Hematocrit 78.2 - 46.0 % 42.5   Platelets 150 - 400 K/uL 219      Assessment and plan- Patient is a 52 y.o. female here for routine follow-up of hereditary hemochromatosis  CBC and CMP are within normal limits.  Ferritin came back at 106.  Target ferritin  is less than 100.  Will schedule her for 2 weekly sessions of phlebotomy and keep ferritin levels at that time.  Based on that we will decide how many more sessions she needs.  I will continue to monitor her labs every 3 months and I will see her back in 1 year.  Patient does not have any baseline cirrhosis and therefore does not require HCC surveillance   Visit Diagnosis 1. Hereditary hemochromatosis ([facility])      Dr. Owens Shark, MD, MPH [facility] [phone] 07/12/2022 11:33 AM

## 2022-07-12 NOTE — Telephone Encounter (Signed)
called pt to schedule phlebotomy weekly X 2. Check ferritin with second session. LVM with this info asked her to call back so we can schedule

## 2022-07-17 ENCOUNTER — Inpatient Hospital Stay: Payer: 59

## 2022-07-17 VITALS — BP 154/95 | HR 86 | Temp 97.2°F

## 2022-07-17 DIAGNOSIS — R7989 Other specified abnormal findings of blood chemistry: Secondary | ICD-10-CM

## 2022-07-24 ENCOUNTER — Inpatient Hospital Stay: Payer: 59

## 2022-07-24 VITALS — BP 164/96 | HR 85 | Temp 97.2°F

## 2022-07-24 DIAGNOSIS — R7989 Other specified abnormal findings of blood chemistry: Secondary | ICD-10-CM

## 2022-07-24 LAB — FERRITIN: Ferritin: 93 ng/mL (ref 11–307)

## 2022-08-17 ENCOUNTER — Other Ambulatory Visit: Payer: Self-pay | Admitting: Obstetrics and Gynecology

## 2022-08-17 DIAGNOSIS — Z1231 Encounter for screening mammogram for malignant neoplasm of breast: Secondary | ICD-10-CM

## 2022-09-04 ENCOUNTER — Ambulatory Visit
Admission: RE | Admit: 2022-09-04 | Discharge: 2022-09-04 | Disposition: A | Discharge status: Home / Self Care | Payer: 59 | Source: Ambulatory Visit | Attending: Obstetrics and Gynecology | Admitting: Obstetrics and Gynecology

## 2022-09-04 DIAGNOSIS — Z1231 Encounter for screening mammogram for malignant neoplasm of breast: Secondary | ICD-10-CM | POA: Diagnosis present

## 2022-10-11 ENCOUNTER — Inpatient Hospital Stay: Payer: 59 | Attending: Oncology

## 2022-10-15 ENCOUNTER — Other Ambulatory Visit: Payer: Self-pay | Admitting: *Deleted

## 2022-10-16 ENCOUNTER — Inpatient Hospital Stay: Payer: 59 | Attending: Oncology

## 2022-10-16 LAB — CBC WITH DIFFERENTIAL/PLATELET
Abs Immature Granulocytes: 0.01 10*3/uL (ref 0.00–0.07)
Basophils Absolute: 0 10*3/uL (ref 0.0–0.1)
Basophils Relative: 1 %
Eosinophils Absolute: 0.1 10*3/uL (ref 0.0–0.5)
Eosinophils Relative: 1 %
HCT: 45.1 % (ref 36.0–46.0)
Hemoglobin: 15.6 g/dL — ABNORMAL HIGH (ref 12.0–15.0)
Immature Granulocytes: 0 %
Lymphocytes Relative: 30 %
Lymphs Abs: 1.8 10*3/uL (ref 0.7–4.0)
MCH: 33.8 pg (ref 26.0–34.0)
MCHC: 34.6 g/dL (ref 30.0–36.0)
MCV: 97.8 fL (ref 80.0–100.0)
Monocytes Absolute: 0.4 10*3/uL (ref 0.1–1.0)
Monocytes Relative: 7 %
Neutro Abs: 3.7 10*3/uL (ref 1.7–7.7)
Neutrophils Relative %: 61 %
Platelets: 194 10*3/uL (ref 150–400)
RBC: 4.61 MIL/uL (ref 3.87–5.11)
RDW: 11.9 % (ref 11.5–15.5)
WBC: 6 10*3/uL (ref 4.0–10.5)
nRBC: 0 % (ref 0.0–0.2)

## 2022-10-16 LAB — COMPREHENSIVE METABOLIC PANEL
ALT: 17 U/L (ref 0–44)
AST: 24 U/L (ref 15–41)
Albumin: 4.1 g/dL (ref 3.5–5.0)
Alkaline Phosphatase: 68 U/L (ref 38–126)
Anion gap: 10 (ref 5–15)
BUN: 15 mg/dL (ref 6–20)
CO2: 22 mmol/L (ref 22–32)
Calcium: 9.1 mg/dL (ref 8.9–10.3)
Chloride: 104 mmol/L (ref 98–111)
Creatinine, Ser: 0.83 mg/dL (ref 0.44–1.00)
GFR, Estimated: >60 mL/min (ref >60)
Glucose, Bld: 153 mg/dL — ABNORMAL HIGH (ref 70–99)
Potassium: 3.6 mmol/L (ref 3.5–5.1)
Sodium: 136 mmol/L (ref 135–145)
Total Bilirubin: 0.9 mg/dL (ref 0.3–1.2)
Total Protein: 7.3 g/dL (ref 6.5–8.1)

## 2022-10-16 LAB — IRON AND TIBC
Iron: 154 ug/dL (ref 28–170)
Saturation Ratios: 47 % — ABNORMAL HIGH (ref 10.4–31.8)
TIBC: 329 ug/dL (ref 250–450)
UIBC: 175 ug/dL

## 2022-10-16 LAB — FERRITIN: Ferritin: 83 ng/mL (ref 11–307)

## 2022-12-25 ENCOUNTER — Other Ambulatory Visit: Payer: Self-pay | Admitting: *Deleted

## 2022-12-25 MED ORDER — CYANOCOBALAMIN 1000 MCG/ML IJ SOLN: 1000.0000 ug | INTRAMUSCULAR | 11 refills | Status: DC

## 2023-01-11 ENCOUNTER — Inpatient Hospital Stay: Payer: 59

## 2023-01-17 ENCOUNTER — Other Ambulatory Visit: Payer: Self-pay | Admitting: *Deleted

## 2023-01-18 ENCOUNTER — Inpatient Hospital Stay: Payer: 59 | Attending: Oncology

## 2023-01-18 LAB — CBC WITH DIFFERENTIAL/PLATELET
Abs Immature Granulocytes: 0.02 10*3/uL (ref 0.00–0.07)
Basophils Absolute: 0 10*3/uL (ref 0.0–0.1)
Basophils Relative: 0 %
Eosinophils Absolute: 0.1 10*3/uL (ref 0.0–0.5)
Eosinophils Relative: 1 %
HCT: 42.8 % (ref 36.0–46.0)
Hemoglobin: 14.7 g/dL (ref 12.0–15.0)
Immature Granulocytes: 0 %
Lymphocytes Relative: 26 %
Lymphs Abs: 1.9 10*3/uL (ref 0.7–4.0)
MCH: 33.9 pg (ref 26.0–34.0)
MCHC: 34.3 g/dL (ref 30.0–36.0)
MCV: 98.8 fL (ref 80.0–100.0)
Monocytes Absolute: 0.7 10*3/uL (ref 0.1–1.0)
Monocytes Relative: 10 %
Neutro Abs: 4.6 10*3/uL (ref 1.7–7.7)
Neutrophils Relative %: 63 %
Platelets: 197 10*3/uL (ref 150–400)
RBC: 4.33 MIL/uL (ref 3.87–5.11)
RDW: 11.9 % (ref 11.5–15.5)
WBC: 7.3 10*3/uL (ref 4.0–10.5)
nRBC: 0 % (ref 0.0–0.2)

## 2023-01-18 LAB — IRON AND TIBC
Iron: 186 ug/dL — ABNORMAL HIGH (ref 28–170)
Saturation Ratios: 57 % — ABNORMAL HIGH (ref 10.4–31.8)
TIBC: 325 ug/dL (ref 250–450)
UIBC: 139 ug/dL

## 2023-01-18 LAB — COMPREHENSIVE METABOLIC PANEL
ALT: 19 U/L (ref 0–44)
AST: 22 U/L (ref 15–41)
Albumin: 4.1 g/dL (ref 3.5–5.0)
Alkaline Phosphatase: 71 U/L (ref 38–126)
Anion gap: 10 (ref 5–15)
BUN: 11 mg/dL (ref 6–20)
CO2: 22 mmol/L (ref 22–32)
Calcium: 9 mg/dL (ref 8.9–10.3)
Chloride: 104 mmol/L (ref 98–111)
Creatinine, Ser: 0.68 mg/dL (ref 0.44–1.00)
GFR, Estimated: >60 mL/min (ref >60)
Glucose, Bld: 95 mg/dL (ref 70–99)
Potassium: 4 mmol/L (ref 3.5–5.1)
Sodium: 136 mmol/L (ref 135–145)
Total Bilirubin: 1.1 mg/dL (ref <1.2)
Total Protein: 7 g/dL (ref 6.5–8.1)

## 2023-01-18 LAB — FERRITIN: Ferritin: 101 ng/mL (ref 11–307)

## 2023-01-23 ENCOUNTER — Ambulatory Visit
Admission: EM | Admit: 2023-01-23 | Discharge: 2023-01-23 | Disposition: A | Discharge status: Home / Self Care | Payer: 59 | Attending: Emergency Medicine | Admitting: Emergency Medicine

## 2023-01-23 ENCOUNTER — Encounter: Payer: Self-pay | Admitting: Oncology

## 2023-01-23 ENCOUNTER — Ambulatory Visit: Payer: 59

## 2023-01-23 DIAGNOSIS — J189 Pneumonia, unspecified organism: Secondary | ICD-10-CM | POA: Insufficient documentation

## 2023-01-23 DIAGNOSIS — R509 Fever, unspecified: Secondary | ICD-10-CM | POA: Diagnosis present

## 2023-01-23 DIAGNOSIS — Z20822 Contact with and (suspected) exposure to covid-19: Secondary | ICD-10-CM | POA: Insufficient documentation

## 2023-01-23 DIAGNOSIS — R059 Cough, unspecified: Secondary | ICD-10-CM | POA: Diagnosis present

## 2023-01-23 DIAGNOSIS — Z79899 Other long term (current) drug therapy: Secondary | ICD-10-CM | POA: Insufficient documentation

## 2023-01-23 LAB — SARS CORONAVIRUS 2 BY RT PCR: SARS Coronavirus 2 by RT PCR: NEGATIVE

## 2023-01-23 MED ORDER — AZITHROMYCIN 250 MG PO TABS: 250.0000 mg | ORAL_TABLET | Freq: Every day | ORAL | 0 refills | Status: AC

## 2023-01-23 MED ORDER — PROMETHAZINE-DM 6.25-15 MG/5ML PO SYRP: 5.0000 mL | ORAL_SOLUTION | Freq: Four times a day (QID) | ORAL | 0 refills | Status: AC | PRN

## 2023-01-23 MED ORDER — BENZONATATE 100 MG PO CAPS: 200.0000 mg | ORAL_CAPSULE | Freq: Three times a day (TID) | ORAL | 0 refills | Status: AC

## 2023-01-23 MED ORDER — AMOXICILLIN-POT CLAVULANATE 875-125 MG PO TABS: 1.0000 | ORAL_TABLET | Freq: Two times a day (BID) | ORAL | 0 refills | Status: AC

## 2023-01-23 NOTE — ED Triage Notes (Signed)
Pt c/o fever,cough & bodyaches x3 days. Tmax 104 Sunday.

## 2023-01-23 NOTE — ED Provider Notes (Signed)
[facility]    CSN: [id number] Arrival date & time: 01/23/23  0807      History   Chief Complaint Chief Complaint  Patient presents with   Fever   Cough   Generalized Body Aches    HPI Kristin Hutchinson is a 52 y.o. female.   HPI  52 year old female with a past medical history significant for hereditary hemochromatosis presents for evaluation of fever with a Tmax of 104, body aches, and a cough that is intermittently productive for the last 3 days.  She states she does have nasal congestion but she has not had any nasal discharge.  Her throat is only sore if she swallows but not on a regular basis.  She denies ear pain, shortness of breath, wheezing, GI complaints, known sick contacts, or recent travel.  Past Medical History:  Diagnosis Date   Blood disorder    COVID-19 12/2018   Hereditary hemochromatosis (HCC)    Wears contact lenses     Patient Active Problem List   Diagnosis Date Noted   Special screening for malignant neoplasms, colon    Rectal polyp    B12 deficiency 03/23/2020   Folate deficiency 03/23/2020   Macrocytosis 02/10/2020   Hereditary hemochromatosis (HCC) 11/03/2019   Elevated ferritin 10/21/2019   Family history of hemochromatosis 10/21/2019   Annual physical exam 01/17/2017    Past Surgical History:  Procedure Laterality Date   BREAST BIOPSY Right 12/03/2009   neg   COLONOSCOPY WITH PROPOFOL N/A 05/06/2020   Procedure: COLONOSCOPY WITH BIOPSY;  Surgeon: Midge Minium, MD;  Location: [facility];  Service: Endoscopy;  Laterality: N/A;  priority 4   POLYPECTOMY N/A 05/06/2020   Procedure: POLYPECTOMY;  Surgeon: Midge Minium, MD;  Location: [facility];  Service: Endoscopy;  Laterality: N/A;    OB History     Gravida  0   Para  0   Term  0   Preterm  0   AB  0   Living  0      SAB  0   IAB  0   Ectopic  0   Multiple  0   Live Births  0            Home Medications    Prior to Admission  medications   Medication Sig Start Date End Date Taking? Authorizing Provider  amoxicillin-clavulanate (AUGMENTIN) 875-125 MG tablet Take 1 tablet by mouth every 12 (twelve) hours for 7 days. 01/23/23 01/30/23 Yes Becky Augusta, NP  azithromycin (ZITHROMAX Z-PAK) 250 MG tablet Take 1 tablet (250 mg total) by mouth daily. Take 2 tablets on the first day and then 1 tablet daily thereafter for a total of 5 days of treatment. 01/23/23  Yes Becky Augusta, NP  B-D 3CC LUER-LOK SYR 25GX1" 25G X 1" 3 ML MISC USE AS DIRECTED 12/25/21  Yes Alinda Dooms, NP  benzonatate (TESSALON) 100 MG capsule Take 2 capsules (200 mg total) by mouth every 8 (eight) hours. 01/23/23  Yes Becky Augusta, NP  Cholecalciferol (VITAMIN D3 PO) Take by mouth daily.   Yes [provider]  cyanocobalamin (VITAMIN B12) 1000 MCG/ML injection Inject 1 mL (1,000 mcg total) into the skin every 30 (thirty) days. 12/25/22  Yes Creig Hines, MD  folic acid (FOLVITE) 1 MG tablet Take 1 mg by mouth daily.   Yes [provider]  Menaquinone-7 (VITAMIN K2 PO) Take by mouth.   Yes [provider]  Multiple Vitamin (MULTI-VITAMIN) tablet  Take 1 tablet by mouth daily.   Yes [provider]  promethazine-dextromethorphan (PROMETHAZINE-DM) 6.25-15 MG/5ML syrup Take 5 mLs by mouth 4 (four) times daily as needed. 01/23/23  Yes Becky Augusta, NP  valACYclovir (VALTREX) 1000 MG tablet Take 4,000 mg by mouth once. 10/17/20  Yes [provider]  valsartan (DIOVAN) 80 MG tablet Take by mouth. 01/10/23 01/10/24 Yes [provider]    Family History Family History  Problem Relation Age of Onset   Breast cancer Mother 5   Hypertension Mother    Diabetes Father    Hypertension Father     Social History Social History   Tobacco Use   Smoking status: Never   Smokeless tobacco: Never  Vaping Use   Vaping status: Never Used  Substance Use Topics   Alcohol use: Yes    Alcohol/week: 5.0 standard  drinks of alcohol    Types: 5 Cans of beer per week    Comment: weekends   Drug use: No     Allergies   Patient has no known allergies.   Review of Systems Review of Systems  Constitutional:  Positive for fever.  HENT:  Positive for congestion. Negative for ear pain, rhinorrhea and sore throat.   Respiratory:  Positive for cough. Negative for shortness of breath and wheezing.   Gastrointestinal:  Negative for abdominal pain, diarrhea, nausea and vomiting.  Musculoskeletal:  Positive for arthralgias and myalgias.  Skin:  Negative for rash.     Physical Exam Triage Vital Signs ED Triage Vitals [01/23/23 0814]  Encounter Vitals Group     BP      Systolic BP Percentile      Diastolic BP Percentile      Pulse      Resp 16     Temp      Temp Source Oral     SpO2      Weight      Height      Head Circumference      Peak Flow      Pain Score      Pain Loc      Pain Education      Exclude from Growth Chart    No data found.  Updated Vital Signs BP 118/80 (BP Location: Left Arm)   Pulse (!) 105   Temp 99.5 F (37.5 C) (Oral)   Resp 16   Ht 5\' 7"  (1.702 m)   Wt 146 lb (66.2 kg)   SpO2 94%   BMI 22.87 kg/m   Visual Acuity Right Eye Distance:   Left Eye Distance:   Bilateral Distance:    Right Eye Near:   Left Eye Near:    Bilateral Near:     Physical Exam Vitals and nursing note reviewed.  Constitutional:      Appearance: Normal appearance. She is not ill-appearing.  HENT:     Head: Normocephalic and atraumatic.     Right Ear: Tympanic membrane, ear canal and external ear normal. There is no impacted cerumen.     Left Ear: Tympanic membrane, ear canal and external ear normal. There is no impacted cerumen.     Nose: Congestion and rhinorrhea present.     Comments: Nasal mucosa is erythematous and edematous, more so on the right than the left, with clear nasal discharge.    Mouth/Throat:     Mouth: Mucous membranes are moist.     Pharynx: Oropharynx is  clear. Posterior oropharyngeal erythema present. No oropharyngeal exudate.  Comments: Mild erythema to the posterior oropharynx with clear postnasal drip. Cardiovascular:     Rate and Rhythm: Normal rate and regular rhythm.     Pulses: Normal pulses.     Heart sounds: Normal heart sounds. No murmur heard.    No friction rub. No gallop.  Pulmonary:     Effort: Pulmonary effort is normal.     Breath sounds: Normal breath sounds. No wheezing, rhonchi or rales.  Musculoskeletal:     Cervical back: Normal range of motion and neck supple. No tenderness.  Lymphadenopathy:     Cervical: No cervical adenopathy.  Skin:    General: Skin is warm and dry.     Capillary Refill: Capillary refill takes less than 2 seconds.     Findings: No rash.  Neurological:     General: No focal deficit present.     Mental Status: She is alert and oriented to person, place, and time.      UC Treatments / Results  Labs (all labs ordered are listed, but only abnormal results are displayed) Labs Reviewed  SARS CORONAVIRUS 2 BY RT PCR    EKG   Radiology No results found.  Procedures Procedures (including critical care time)  Medications Ordered in UC Medications - No data to display  Initial Impression / Assessment and Plan / UC Course  I have reviewed the triage vital signs and the nursing notes.  Pertinent labs & imaging results that were available during my care of the patient were reviewed by me and considered in my medical decision making (see chart for details).   Patient is a pleasant, nontoxic-appearing 52 year old female presenting for evaluation of respiratory symptoms as outlined HPI above.  She reports having a fever up to 104 with some nasal congestion and intermittently productive cough.  Her physical exam does reveal inflamed nasal mucosa with clear rhinorrhea, erythema to the posterior pharynx with clear postnasal drip, and lung sounds are clear to auscultation all fields.  Given  her cluster of symptoms I will order a COVID PCR.  I will not order flu at this time as she has had symptoms for 3 days and is outside the therapeutic window for Tamiflu.  I will also order chest x-ray to evaluate for the presence of pneumonia given that her cough is intermittently productive.  COVID PCR is negative.  Chest x-ray independently reviewed and evaluated by me.  Pression: Patient has a patchy infiltrate in the right lower lobe.  Remainder lung fields are clear.  Radiology overread is pending.  I will discharge patient on the diagnosis of community-acquired pneumonia and start her on Augmentin 875 twice daily for 7 days and azithromycin once daily for 5 days.  Tessalon Perles (DM cough syrup for cough and congestion.  Over-the-counter Tylenol and/or ibuprofen as needed for fever or pain.  Return precautions reviewed.  Work note provided.   Final Clinical Impressions(s) / UC Diagnoses   Final diagnoses:  Community acquired pneumonia of right lower lobe of lung     Discharge Instructions      Take the Augmentin twice daily with food for 7 days for treatment of your pneumonia.  Take the azithromycin as directed.  You will take 2 tablets on day 1 and then 1 tablet each day after that for total of 5 days for treatment of your pneumonia.  Use the Tessalon Perles every 8 hours during the day as needed for cough.  Taken with a small sip of water.  These may give  you numbness to the base of your tongue or metallic taste in mouth, this is normal.  Use the Promethazine DM cough syrup at bedtime for cough and congestion as it would make you drowsy.  Use over-the-counter Tylenol and/or ibuprofen according the package instructions as needed for any fever or pain.  Return for reevaluation if you have any new or worsening symptoms.  Follow-up with your primary care provider in 4 to 6 weeks for a repeat chest x-ray to ensure resolution of your pneumonia.      ED Prescriptions      Medication Sig Dispense Auth. Provider   amoxicillin-clavulanate (AUGMENTIN) 875-125 MG tablet Take 1 tablet by mouth every 12 (twelve) hours for 7 days. 14 tablet Becky Augusta, NP   azithromycin (ZITHROMAX Z-PAK) 250 MG tablet Take 1 tablet (250 mg total) by mouth daily. Take 2 tablets on the first day and then 1 tablet daily thereafter for a total of 5 days of treatment. 6 tablet Becky Augusta, NP   benzonatate (TESSALON) 100 MG capsule Take 2 capsules (200 mg total) by mouth every 8 (eight) hours. 21 capsule Becky Augusta, NP   promethazine-dextromethorphan (PROMETHAZINE-DM) 6.25-15 MG/5ML syrup Take 5 mLs by mouth 4 (four) times daily as needed. 118 mL Becky Augusta, NP      PDMP not reviewed this encounter.   Becky Augusta, NP 01/23/23 [phone]

## 2023-01-23 NOTE — Discharge Instructions (Addendum)
Take the Augmentin twice daily with food for 7 days for treatment of your pneumonia.  Take the azithromycin as directed.  You will take 2 tablets on day 1 and then 1 tablet each day after that for total of 5 days for treatment of your pneumonia.  Use the Tessalon Perles every 8 hours during the day as needed for cough.  Taken with a small sip of water.  These may give you numbness to the base of your tongue or metallic taste in mouth, this is normal.  Use the Promethazine DM cough syrup at bedtime for cough and congestion as it would make you drowsy.  Use over-the-counter Tylenol and/or ibuprofen according the package instructions as needed for any fever or pain.  Return for reevaluation if you have any new or worsening symptoms.  Follow-up with your primary care provider in 4 to 6 weeks for a repeat chest x-ray to ensure resolution of your pneumonia.

## 2023-03-07 ENCOUNTER — Encounter: Payer: Self-pay | Admitting: Oncology

## 2023-04-11 ENCOUNTER — Other Ambulatory Visit: Payer: Self-pay | Admitting: *Deleted

## 2023-04-12 ENCOUNTER — Inpatient Hospital Stay: Payer: 59 | Attending: Oncology

## 2023-04-17 ENCOUNTER — Inpatient Hospital Stay: Payer: 59 | Attending: Oncology

## 2023-04-17 LAB — IRON AND TIBC
Iron: 254 ug/dL — ABNORMAL HIGH (ref 28–170)
Saturation Ratios: 88 % — ABNORMAL HIGH (ref 10.4–31.8)
TIBC: 290 ug/dL (ref 250–450)
UIBC: 36 ug/dL

## 2023-04-17 LAB — CMP (CANCER CENTER ONLY)
ALT: 17 U/L (ref 0–44)
AST: 27 U/L (ref 15–41)
Albumin: 3.9 g/dL (ref 3.5–5.0)
Alkaline Phosphatase: 63 U/L (ref 38–126)
Anion gap: 9 (ref 5–15)
BUN: 15 mg/dL (ref 6–20)
CO2: 24 mmol/L (ref 22–32)
Calcium: 8.8 mg/dL — ABNORMAL LOW (ref 8.9–10.3)
Chloride: 101 mmol/L (ref 98–111)
Creatinine: 0.66 mg/dL (ref 0.44–1.00)
GFR, Estimated: >60 mL/min (ref >60)
Glucose, Bld: 103 mg/dL — ABNORMAL HIGH (ref 70–99)
Potassium: 3.8 mmol/L (ref 3.5–5.1)
Sodium: 134 mmol/L — ABNORMAL LOW (ref 135–145)
Total Bilirubin: 1.4 mg/dL — ABNORMAL HIGH (ref 0.0–1.2)
Total Protein: 6.9 g/dL (ref 6.5–8.1)

## 2023-04-17 LAB — CBC WITH DIFFERENTIAL/PLATELET
Abs Immature Granulocytes: 0.01 10*3/uL (ref 0.00–0.07)
Basophils Absolute: 0 10*3/uL (ref 0.0–0.1)
Basophils Relative: 1 %
Eosinophils Absolute: 0.1 10*3/uL (ref 0.0–0.5)
Eosinophils Relative: 1 %
HCT: 43.6 % (ref 36.0–46.0)
Hemoglobin: 14.9 g/dL (ref 12.0–15.0)
Immature Granulocytes: 0 %
Lymphocytes Relative: 33 %
Lymphs Abs: 2.1 10*3/uL (ref 0.7–4.0)
MCH: 32.5 pg (ref 26.0–34.0)
MCHC: 34.2 g/dL (ref 30.0–36.0)
MCV: 95.2 fL (ref 80.0–100.0)
Monocytes Absolute: 0.6 10*3/uL (ref 0.1–1.0)
Monocytes Relative: 9 %
Neutro Abs: 3.5 10*3/uL (ref 1.7–7.7)
Neutrophils Relative %: 56 %
Platelets: 229 10*3/uL (ref 150–400)
RBC: 4.58 MIL/uL (ref 3.87–5.11)
RDW: 12.8 % (ref 11.5–15.5)
WBC: 6.3 10*3/uL (ref 4.0–10.5)
nRBC: 0 % (ref 0.0–0.2)

## 2023-04-17 LAB — FERRITIN: Ferritin: 250 ng/mL (ref 11–307)

## 2023-04-19 ENCOUNTER — Other Ambulatory Visit: Payer: Self-pay

## 2023-04-19 ENCOUNTER — Telehealth: Payer: Self-pay

## 2023-04-19 NOTE — Telephone Encounter (Signed)
Called informed patient of apts and plan. No further concerns

## 2023-04-19 NOTE — Telephone Encounter (Signed)
-----   Message from Creig Hines sent at 04/17/2023  1:28 PM EST ----- Please schedule for weekly phlebotomy X4. Ferritin levels to be done with 2nd and 4th session. H/H with every session

## 2023-04-24 ENCOUNTER — Inpatient Hospital Stay: Payer: 59

## 2023-04-26 ENCOUNTER — Inpatient Hospital Stay: Payer: 59

## 2023-04-30 ENCOUNTER — Other Ambulatory Visit: Payer: Self-pay

## 2023-04-30 DIAGNOSIS — R7989 Other specified abnormal findings of blood chemistry: Secondary | ICD-10-CM

## 2023-05-01 ENCOUNTER — Inpatient Hospital Stay: Payer: 59

## 2023-05-01 VITALS — BP 129/68 | HR 92

## 2023-05-01 DIAGNOSIS — R7989 Other specified abnormal findings of blood chemistry: Secondary | ICD-10-CM

## 2023-05-01 LAB — COMPREHENSIVE METABOLIC PANEL
ALT: 16 U/L (ref 0–44)
AST: 25 U/L (ref 15–41)
Albumin: 3.9 g/dL (ref 3.5–5.0)
Alkaline Phosphatase: 59 U/L (ref 38–126)
Anion gap: 10 (ref 5–15)
BUN: 14 mg/dL (ref 6–20)
CO2: 23 mmol/L (ref 22–32)
Calcium: 8.9 mg/dL (ref 8.9–10.3)
Chloride: 102 mmol/L (ref 98–111)
Creatinine, Ser: 0.67 mg/dL (ref 0.44–1.00)
GFR, Estimated: >60 mL/min (ref >60)
Glucose, Bld: 125 mg/dL — ABNORMAL HIGH (ref 70–99)
Potassium: 3.9 mmol/L (ref 3.5–5.1)
Sodium: 135 mmol/L (ref 135–145)
Total Bilirubin: 1.2 mg/dL (ref 0.0–1.2)
Total Protein: 6.8 g/dL (ref 6.5–8.1)

## 2023-05-01 LAB — HEMOGLOBIN AND HEMATOCRIT (CANCER CENTER ONLY)
HCT: 41.1 % (ref 36.0–46.0)
Hemoglobin: 14.5 g/dL (ref 12.0–15.0)

## 2023-05-01 LAB — IRON AND TIBC
Iron: 258 ug/dL — ABNORMAL HIGH (ref 28–170)
Saturation Ratios: 89 % — ABNORMAL HIGH (ref 10.4–31.8)
TIBC: 290 ug/dL (ref 250–450)
UIBC: 32 ug/dL

## 2023-05-01 LAB — FERRITIN: Ferritin: 227 ng/mL (ref 11–307)

## 2023-05-03 ENCOUNTER — Inpatient Hospital Stay: Payer: 59

## 2023-05-07 ENCOUNTER — Other Ambulatory Visit: Payer: Self-pay

## 2023-05-07 DIAGNOSIS — R7989 Other specified abnormal findings of blood chemistry: Secondary | ICD-10-CM

## 2023-05-08 ENCOUNTER — Inpatient Hospital Stay: Payer: 59

## 2023-05-10 ENCOUNTER — Inpatient Hospital Stay: Payer: 59

## 2023-05-14 ENCOUNTER — Other Ambulatory Visit: Payer: Self-pay

## 2023-05-14 DIAGNOSIS — R7989 Other specified abnormal findings of blood chemistry: Secondary | ICD-10-CM

## 2023-05-15 ENCOUNTER — Inpatient Hospital Stay: Payer: 59 | Attending: Oncology

## 2023-05-15 ENCOUNTER — Inpatient Hospital Stay: Payer: 59

## 2023-05-15 VITALS — BP 146/68 | HR 94

## 2023-05-15 DIAGNOSIS — R7989 Other specified abnormal findings of blood chemistry: Secondary | ICD-10-CM

## 2023-05-15 LAB — HEMOGLOBIN AND HEMATOCRIT, BLOOD
HCT: 36.8 % (ref 36.0–46.0)
Hemoglobin: 12.6 g/dL (ref 12.0–15.0)

## 2023-05-15 LAB — FERRITIN: Ferritin: 197 ng/mL (ref 11–307)

## 2023-05-17 ENCOUNTER — Inpatient Hospital Stay: Payer: 59

## 2023-05-21 ENCOUNTER — Other Ambulatory Visit: Payer: Self-pay

## 2023-05-22 ENCOUNTER — Inpatient Hospital Stay: Payer: 59

## 2023-05-22 VITALS — BP 139/78

## 2023-05-22 DIAGNOSIS — R7989 Other specified abnormal findings of blood chemistry: Secondary | ICD-10-CM

## 2023-05-22 LAB — HEMOGLOBIN AND HEMATOCRIT, BLOOD
HCT: 38.1 % (ref 36.0–46.0)
Hemoglobin: 12.7 g/dL (ref 12.0–15.0)

## 2023-05-28 ENCOUNTER — Other Ambulatory Visit: Payer: Self-pay

## 2023-05-28 DIAGNOSIS — R7989 Other specified abnormal findings of blood chemistry: Secondary | ICD-10-CM

## 2023-05-29 ENCOUNTER — Inpatient Hospital Stay

## 2023-05-29 VITALS — BP 134/76 | HR 88

## 2023-05-29 DIAGNOSIS — R7989 Other specified abnormal findings of blood chemistry: Secondary | ICD-10-CM

## 2023-05-29 LAB — HEMOGLOBIN AND HEMATOCRIT (CANCER CENTER ONLY)
HCT: 38 % (ref 36.0–46.0)
Hemoglobin: 12.8 g/dL (ref 12.0–15.0)

## 2023-07-09 ENCOUNTER — Other Ambulatory Visit: Payer: Self-pay | Admitting: *Deleted

## 2023-07-09 DIAGNOSIS — R7989 Other specified abnormal findings of blood chemistry: Secondary | ICD-10-CM

## 2023-07-10 ENCOUNTER — Inpatient Hospital Stay

## 2023-07-10 ENCOUNTER — Inpatient Hospital Stay: Payer: 59 | Attending: Oncology

## 2023-07-10 ENCOUNTER — Encounter: Payer: Self-pay | Admitting: Oncology

## 2023-07-10 ENCOUNTER — Inpatient Hospital Stay (HOSPITAL_BASED_OUTPATIENT_CLINIC_OR_DEPARTMENT_OTHER): Payer: 59 | Admitting: Oncology

## 2023-07-10 VITALS — BP 145/87 | HR 88 | Temp 97.9°F | Resp 18

## 2023-07-10 DIAGNOSIS — Z803 Family history of malignant neoplasm of breast: Secondary | ICD-10-CM | POA: Insufficient documentation

## 2023-07-10 DIAGNOSIS — Z8701 Personal history of pneumonia (recurrent): Secondary | ICD-10-CM | POA: Insufficient documentation

## 2023-07-10 DIAGNOSIS — R7989 Other specified abnormal findings of blood chemistry: Secondary | ICD-10-CM

## 2023-07-10 LAB — CBC WITH DIFFERENTIAL/PLATELET
Abs Immature Granulocytes: 0.03 10*3/uL (ref 0.00–0.07)
Basophils Absolute: 0 10*3/uL (ref 0.0–0.1)
Basophils Relative: 1 %
Eosinophils Absolute: 0.1 10*3/uL (ref 0.0–0.5)
Eosinophils Relative: 1 %
HCT: 43.4 % (ref 36.0–46.0)
Hemoglobin: 15 g/dL (ref 12.0–15.0)
Immature Granulocytes: 0 %
Lymphocytes Relative: 28 %
Lymphs Abs: 2 10*3/uL (ref 0.7–4.0)
MCH: 33.2 pg (ref 26.0–34.0)
MCHC: 34.6 g/dL (ref 30.0–36.0)
MCV: 96 fL (ref 80.0–100.0)
Monocytes Absolute: 0.7 10*3/uL (ref 0.1–1.0)
Monocytes Relative: 9 %
Neutro Abs: 4.2 10*3/uL (ref 1.7–7.7)
Neutrophils Relative %: 61 %
Platelets: 193 10*3/uL (ref 150–400)
RBC: 4.52 MIL/uL (ref 3.87–5.11)
RDW: 11.5 % (ref 11.5–15.5)
WBC: 7 10*3/uL (ref 4.0–10.5)
nRBC: 0 % (ref 0.0–0.2)

## 2023-07-10 LAB — IRON AND TIBC
Iron: 137 ug/dL (ref 28–170)
Saturation Ratios: 36 % — ABNORMAL HIGH (ref 10.4–31.8)
TIBC: 386 ug/dL (ref 250–450)
UIBC: 249 ug/dL

## 2023-07-10 LAB — CMP (CANCER CENTER ONLY)
ALT: 14 U/L (ref 0–44)
AST: 22 U/L (ref 15–41)
Albumin: 4.2 g/dL (ref 3.5–5.0)
Alkaline Phosphatase: 62 U/L (ref 38–126)
Anion gap: 10 (ref 5–15)
BUN: 13 mg/dL (ref 6–20)
CO2: 24 mmol/L (ref 22–32)
Calcium: 9.1 mg/dL (ref 8.9–10.3)
Chloride: 103 mmol/L (ref 98–111)
Creatinine: 0.57 mg/dL (ref 0.44–1.00)
GFR, Estimated: >60 mL/min (ref >60)
Glucose, Bld: 100 mg/dL — ABNORMAL HIGH (ref 70–99)
Potassium: 4.3 mmol/L (ref 3.5–5.1)
Sodium: 137 mmol/L (ref 135–145)
Total Bilirubin: 0.9 mg/dL (ref 0.0–1.2)
Total Protein: 7.2 g/dL (ref 6.5–8.1)

## 2023-07-10 LAB — FERRITIN: Ferritin: 41 ng/mL (ref 11–307)

## 2023-07-10 NOTE — Progress Notes (Signed)
 Patient tolerated phlebotomy well used 20 gauge IV. Removed* 300 mls as ordered. No concerns and stable at discharge. AVS reviewed and pt refused a copy.

## 2023-07-10 NOTE — Progress Notes (Signed)
 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  This was detected after her mother was diagnosed with hereditary hemochromatosis.  Hepatitis B and C testing negative.  She has required periodic phlebotomies in the past and the goal ferritin is to keep it less than 100.  No known baseline cirrhosis.  She was diagnosed sometime in 2020 when her ferritin levels were close to 900   Interval history-patient feels well overall and denies any complaints today.  She was diagnosed with pneumonia in November 2024 as well as sinus infection and subsequently.  Those symptoms have now presently resolved  ECOG PS- 0 Pain scale- 0   Review of systems- Review of Systems  Constitutional:  Negative for chills, fever, malaise/fatigue and weight loss.  HENT:  Negative for congestion, ear discharge and nosebleeds.   Eyes:  Negative for blurred vision.  Respiratory:  Negative for cough, hemoptysis, sputum production, shortness of breath and wheezing.   Cardiovascular:  Negative for chest pain, palpitations, orthopnea and claudication.  Gastrointestinal:  Negative for abdominal pain, blood in stool, constipation, diarrhea, heartburn, melena, nausea and vomiting.  Genitourinary:  Negative for dysuria, flank pain, frequency, hematuria and urgency.  Musculoskeletal:  Negative for back pain, joint pain and myalgias.  Skin:   Negative for rash.  Neurological:  Negative for dizziness, tingling, focal weakness, seizures, weakness and headaches.  Endo/Heme/Allergies:  Does not bruise/bleed easily.  Psychiatric/Behavioral:  Negative for depression and suicidal ideas. The patient does not have insomnia.       No Known Allergies   Past Medical History:  Diagnosis Date   Blood disorder    COVID-19 12/2018   Hereditary hemochromatosis (HCC)    Wears contact lenses      Past Surgical History:  Procedure Laterality Date   BREAST BIOPSY Right 12/03/2009   neg   COLONOSCOPY WITH PROPOFOL  N/A 05/06/2020   Procedure: COLONOSCOPY WITH BIOPSY;  Surgeon: Marnee Sink, MD;  Location: [facility];  Service: Endoscopy;  Laterality: N/A;  priority 4   POLYPECTOMY N/A 05/06/2020   Procedure: POLYPECTOMY;  Surgeon: Marnee Sink, MD;  Location: [facility];  Service: Endoscopy;  Laterality: N/A;    Social History   Socioeconomic History   Marital status: Married    Spouse name: Not on file   Number of children: Not on file   Years of education: Not on file   Highest education level: Not on file  Occupational History   Not on file  Tobacco Use   Smoking status: Never   Smokeless tobacco: Never  Vaping Use   Vaping status: Never Used  Substance and Sexual Activity   Alcohol use: Yes    Alcohol/week: 5.0 standard drinks of alcohol    Types: 5 Cans of beer per week    Comment: weekends   Drug use: No   Sexual activity: Yes    Birth control/protection: I.U.D.  Other Topics Concern   Not on file  Social History Narrative   Not on file  Social Drivers of Corporate investment banker Strain: Low Risk  (12/03/2022)   Received from [facility]   Overall Financial Resource Strain (CARDIA)    Difficulty of Paying Living Expenses: Not hard at all  Food Insecurity: No Food Insecurity (12/03/2022)   Received from [facility]   Hunger Vital Sign    Worried About  Running Out of Food in the Last Year: Never true    Ran Out of Food in the Last Year: Never true  Transportation Needs: No Transportation Needs (12/03/2022)   Received from [facility]    In the past 12 months, has lack of transportation kept you from medical appointments or from getting medications?: No    Lack of Transportation (Non-Medical): No  Physical Activity: Not on file  Stress: Not on file  Social Connections: Not on file  Intimate Partner Violence: Not on file    Family History  Problem Relation Age of Onset   Breast cancer Mother 74   Hypertension Mother    Diabetes Father    Hypertension Father      Current Outpatient Medications:    levocetirizine (XYZAL) 5 MG tablet, Take 5 mg by mouth daily., Disp: , Rfl:    azithromycin  (ZITHROMAX  Z-PAK) 250 MG tablet, Take 1 tablet (250 mg total) by mouth daily. Take 2 tablets on the first day and then 1 tablet daily thereafter for a total of 5 days of treatment., Disp: 6 tablet, Rfl: 0   B-D 3CC LUER-LOK SYR 25GX1" 25G X 1" 3 ML MISC, USE AS DIRECTED, Disp: 12 each, Rfl: 0   benzonatate  (TESSALON ) 100 MG capsule, Take 2 capsules (200 mg total) by mouth every 8 (eight) hours., Disp: 21 capsule, Rfl: 0   Cholecalciferol (VITAMIN D3 PO), Take by mouth daily., Disp: , Rfl:    cyanocobalamin  (VITAMIN B12) 1000 MCG/ML injection, Inject 1 mL (1,000 mcg total) into the skin every 30 (thirty) days., Disp: 1 mL, Rfl: 11   folic acid  (FOLVITE ) 1 MG tablet, Take 1 mg by mouth daily., Disp: , Rfl:    Menaquinone-7 (VITAMIN K2 PO), Take by mouth., Disp: , Rfl:    Multiple Vitamin (MULTI-VITAMIN) tablet, Take 1 tablet by mouth daily., Disp: , Rfl:    promethazine -dextromethorphan (PROMETHAZINE -DM) 6.25-15 MG/5ML syrup, Take 5 mLs by mouth 4 (four) times daily as needed., Disp: 118 mL, Rfl: 0   valACYclovir (VALTREX) 1000 MG tablet, Take 4,000 mg by mouth once., Disp: , Rfl:    valsartan (DIOVAN) 80 MG  tablet, Take by mouth., Disp: , Rfl:  No current facility-administered medications for this visit.  Facility-Administered Medications Ordered in Other Visits:    cyanocobalamin  ((VITAMIN B-12)) injection 1,000 mcg, 1,000 mcg, Intramuscular, Once, Avonne Boettcher, MD  Physical exam:  Vitals:   07/10/23 1301  BP: (!) 150/90  Pulse: 83  Resp: 18  Temp: (!) 97.4 F (36.3 C)  TempSrc: Tympanic  SpO2: 100%  Weight: 146 lb 4.8 oz (66.4 kg)  Height: 5\' 7"  (1.702 m)   Physical Exam Cardiovascular:     Rate and Rhythm: Normal rate and regular rhythm.     Heart sounds: Normal heart sounds.  Pulmonary:     Effort: Pulmonary effort is normal.     Breath sounds: Normal breath sounds.  Abdominal:     General: Bowel sounds are normal.     Palpations: Abdomen is soft.  Skin:    General: Skin is  warm and dry.  Neurological:     Mental Status: She is alert and oriented to person, place, and time.      I have personally reviewed labs listed below:    Latest Ref Rng & Units 07/10/2023   12:40 PM  CMP  Glucose 70 - 99 mg/dL 161   BUN 6 - 20 mg/dL 13   Creatinine 0.96 - 1.00 mg/dL 0.45   Sodium 409 - 811 mmol/L 137   Potassium 3.5 - 5.1 mmol/L 4.3   Chloride 98 - 111 mmol/L 103   CO2 22 - 32 mmol/L 24   Calcium 8.9 - 10.3 mg/dL 9.1   Total Protein 6.5 - 8.1 g/dL 7.2   Total Bilirubin 0.0 - 1.2 mg/dL 0.9   Alkaline Phos 38 - 126 U/L 62   AST 15 - 41 U/L 22   ALT 0 - 44 U/L 14       Latest Ref Rng & Units 07/10/2023   12:40 PM  CBC  WBC 4.0 - 10.5 K/uL 7.0   Hemoglobin 12.0 - 15.0 g/dL 91.4   Hematocrit 78.2 - 46.0 % 43.4   Platelets 150 - 400 K/uL 193       Assessment and plan- Patient is a 53 y.o. female with history of hereditary hemochromatosis and homozygosity for C282Y here for routine follow-up and possible Phlebotomy  Patient's ferritin levels have been more than 100 since November 2024 and could correlate with her history of pneumonia and sinus infections.  Ferritin  levels from today are pending.  She will proceed with phlebotomy today and based on her ferritin levels today we will decide if she needs more sessions.  Goal is to keep her ferritin less than 100.  I will check her labs every 3 months and see her back in 1 year.  CMP with remains within normal limits and she does not have any known history of cirrhosis and therefore does not require any HCC surveillance.   Visit Diagnosis 1. Hereditary hemochromatosis (HCC)      Dr. Seretha Dance, MD, MPH [facility] [phone] 07/10/2023 1:15 PM

## 2023-07-10 NOTE — Patient Instructions (Signed)

## 2023-10-10 ENCOUNTER — Inpatient Hospital Stay: Attending: Oncology

## 2023-10-10 DIAGNOSIS — R7989 Other specified abnormal findings of blood chemistry: Secondary | ICD-10-CM

## 2023-10-10 LAB — CBC WITH DIFFERENTIAL (CANCER CENTER ONLY)
Abs Immature Granulocytes: 0.03 K/uL (ref 0.00–0.07)
Basophils Absolute: 0 K/uL (ref 0.0–0.1)
Basophils Relative: 1 %
Eosinophils Absolute: 0.1 K/uL (ref 0.0–0.5)
Eosinophils Relative: 1 %
HCT: 39.3 % (ref 36.0–46.0)
Hemoglobin: 13.6 g/dL (ref 12.0–15.0)
Immature Granulocytes: 0 %
Lymphocytes Relative: 25 %
Lymphs Abs: 1.7 K/uL (ref 0.7–4.0)
MCH: 34.1 pg — ABNORMAL HIGH (ref 26.0–34.0)
MCHC: 34.6 g/dL (ref 30.0–36.0)
MCV: 98.5 fL (ref 80.0–100.0)
Monocytes Absolute: 0.6 K/uL (ref 0.1–1.0)
Monocytes Relative: 9 %
Neutro Abs: 4.4 K/uL (ref 1.7–7.7)
Neutrophils Relative %: 64 %
Platelet Count: 183 K/uL (ref 150–400)
RBC: 3.99 MIL/uL (ref 3.87–5.11)
RDW: 13.8 % (ref 11.5–15.5)
WBC Count: 6.8 K/uL (ref 4.0–10.5)
nRBC: 0 % (ref 0.0–0.2)

## 2023-10-10 LAB — FERRITIN: Ferritin: 55 ng/mL (ref 11–307)

## 2023-12-02 ENCOUNTER — Other Ambulatory Visit: Payer: Self-pay | Admitting: Oncology

## 2023-12-03 ENCOUNTER — Encounter: Payer: Self-pay | Admitting: Oncology

## 2023-12-05 ENCOUNTER — Other Ambulatory Visit: Payer: Self-pay | Admitting: Obstetrics and Gynecology

## 2023-12-05 DIAGNOSIS — Z1231 Encounter for screening mammogram for malignant neoplasm of breast: Secondary | ICD-10-CM

## 2024-01-10 ENCOUNTER — Inpatient Hospital Stay

## 2024-01-15 ENCOUNTER — Inpatient Hospital Stay

## 2024-01-16 ENCOUNTER — Ambulatory Visit
Admission: RE | Admit: 2024-01-16 | Discharge: 2024-01-16 | Disposition: A | Discharge status: Home / Self Care | Source: Ambulatory Visit | Attending: Obstetrics and Gynecology | Admitting: Obstetrics and Gynecology

## 2024-01-16 DIAGNOSIS — Z1231 Encounter for screening mammogram for malignant neoplasm of breast: Secondary | ICD-10-CM | POA: Insufficient documentation

## 2024-01-20 ENCOUNTER — Inpatient Hospital Stay: Attending: Oncology

## 2024-01-20 DIAGNOSIS — R7989 Other specified abnormal findings of blood chemistry: Secondary | ICD-10-CM

## 2024-01-20 LAB — CMP (CANCER CENTER ONLY)
ALT: 13 U/L (ref 0–44)
AST: 23 U/L (ref 15–41)
Albumin: 3.9 g/dL (ref 3.5–5.0)
Alkaline Phosphatase: 72 U/L (ref 38–126)
Anion gap: 11 (ref 5–15)
BUN: 8 mg/dL (ref 6–20)
CO2: 22 mmol/L (ref 22–32)
Calcium: 8.7 mg/dL — ABNORMAL LOW (ref 8.9–10.3)
Chloride: 105 mmol/L (ref 98–111)
Creatinine: 0.76 mg/dL (ref 0.44–1.00)
GFR, Estimated: >60 mL/min (ref >60)
Glucose, Bld: 99 mg/dL (ref 70–99)
Potassium: 3.8 mmol/L (ref 3.5–5.1)
Sodium: 138 mmol/L (ref 135–145)
Total Bilirubin: 1.2 mg/dL (ref 0.0–1.2)
Total Protein: 6.7 g/dL (ref 6.5–8.1)

## 2024-01-20 LAB — CBC WITH DIFFERENTIAL (CANCER CENTER ONLY)
Abs Immature Granulocytes: 0.02 K/uL (ref 0.00–0.07)
Basophils Absolute: 0 K/uL (ref 0.0–0.1)
Basophils Relative: 1 %
Eosinophils Absolute: 0.1 K/uL (ref 0.0–0.5)
Eosinophils Relative: 1 %
HCT: 41.9 % (ref 36.0–46.0)
Hemoglobin: 14.6 g/dL (ref 12.0–15.0)
Immature Granulocytes: 0 %
Lymphocytes Relative: 24 %
Lymphs Abs: 1.5 K/uL (ref 0.7–4.0)
MCH: 33.5 pg (ref 26.0–34.0)
MCHC: 34.8 g/dL (ref 30.0–36.0)
MCV: 96.1 fL (ref 80.0–100.0)
Monocytes Absolute: 0.6 K/uL (ref 0.1–1.0)
Monocytes Relative: 9 %
Neutro Abs: 4.1 K/uL (ref 1.7–7.7)
Neutrophils Relative %: 65 %
Platelet Count: 212 K/uL (ref 150–400)
RBC: 4.36 MIL/uL (ref 3.87–5.11)
RDW: 12.2 % (ref 11.5–15.5)
WBC Count: 6.3 K/uL (ref 4.0–10.5)
nRBC: 0 % (ref 0.0–0.2)

## 2024-01-20 LAB — FERRITIN: Ferritin: 81 ng/mL (ref 11–307)

## 2024-04-10 ENCOUNTER — Inpatient Hospital Stay: Attending: Oncology

## 2024-04-10 DIAGNOSIS — R7989 Other specified abnormal findings of blood chemistry: Secondary | ICD-10-CM

## 2024-04-10 LAB — CBC WITH DIFFERENTIAL (CANCER CENTER ONLY)
Abs Immature Granulocytes: 0.02 10*3/uL (ref 0.00–0.07)
Basophils Absolute: 0 10*3/uL (ref 0.0–0.1)
Basophils Relative: 0 %
Eosinophils Absolute: 0.1 10*3/uL (ref 0.0–0.5)
Eosinophils Relative: 1 %
HCT: 42.7 % (ref 36.0–46.0)
Hemoglobin: 15 g/dL (ref 12.0–15.0)
Immature Granulocytes: 0 %
Lymphocytes Relative: 27 %
Lymphs Abs: 2 10*3/uL (ref 0.7–4.0)
MCH: 33.5 pg (ref 26.0–34.0)
MCHC: 35.1 g/dL (ref 30.0–36.0)
MCV: 95.3 fL (ref 80.0–100.0)
Monocytes Absolute: 0.7 10*3/uL (ref 0.1–1.0)
Monocytes Relative: 10 %
Neutro Abs: 4.6 10*3/uL (ref 1.7–7.7)
Neutrophils Relative %: 62 %
Platelet Count: 238 10*3/uL (ref 150–400)
RBC: 4.48 MIL/uL (ref 3.87–5.11)
RDW: 12 % (ref 11.5–15.5)
WBC Count: 7.5 10*3/uL (ref 4.0–10.5)
nRBC: 0 % (ref 0.0–0.2)

## 2024-04-10 LAB — CMP (CANCER CENTER ONLY)
ALT: 12 U/L (ref 0–44)
AST: 22 U/L (ref 15–41)
Albumin: 4.4 g/dL (ref 3.5–5.0)
Alkaline Phosphatase: 89 U/L (ref 38–126)
Anion gap: 13 (ref 5–15)
BUN: 10 mg/dL (ref 6–20)
CO2: 22 mmol/L (ref 22–32)
Calcium: 9.6 mg/dL (ref 8.9–10.3)
Chloride: 103 mmol/L (ref 98–111)
Creatinine: 0.71 mg/dL (ref 0.44–1.00)
GFR, Estimated: >60 mL/min
Glucose, Bld: 98 mg/dL (ref 70–99)
Potassium: 4.2 mmol/L (ref 3.5–5.1)
Sodium: 138 mmol/L (ref 135–145)
Total Bilirubin: 1 mg/dL (ref 0.0–1.2)
Total Protein: 7 g/dL (ref 6.5–8.1)

## 2024-04-10 LAB — FERRITIN: Ferritin: 115 ng/mL (ref 11–307)

## 2024-07-10 ENCOUNTER — Other Ambulatory Visit

## 2024-07-10 ENCOUNTER — Ambulatory Visit: Admitting: Oncology
